# Patient Record
Sex: Female | Born: 1945
Health system: Southern US, Community
[De-identification: ages and names within clinical notes are randomized; demographics above are authoritative.]

## PROBLEM LIST (undated history)

## (undated) DIAGNOSIS — D229 Melanocytic nevi, unspecified: Secondary | ICD-10-CM

## (undated) DIAGNOSIS — R0989 Other specified symptoms and signs involving the circulatory and respiratory systems: Secondary | ICD-10-CM

## (undated) DIAGNOSIS — F32A Depression, unspecified: Secondary | ICD-10-CM

## (undated) DIAGNOSIS — K219 Gastro-esophageal reflux disease without esophagitis: Secondary | ICD-10-CM

## (undated) DIAGNOSIS — F329 Major depressive disorder, single episode, unspecified: Secondary | ICD-10-CM

## (undated) DIAGNOSIS — F419 Anxiety disorder, unspecified: Secondary | ICD-10-CM

## (undated) DIAGNOSIS — C439 Malignant melanoma of skin, unspecified: Secondary | ICD-10-CM

## (undated) DIAGNOSIS — G5600 Carpal tunnel syndrome, unspecified upper limb: Secondary | ICD-10-CM

## (undated) DIAGNOSIS — M1990 Unspecified osteoarthritis, unspecified site: Secondary | ICD-10-CM

## (undated) DIAGNOSIS — N189 Chronic kidney disease, unspecified: Secondary | ICD-10-CM

## (undated) DIAGNOSIS — R51 Headache: Secondary | ICD-10-CM

## (undated) DIAGNOSIS — I10 Essential (primary) hypertension: Secondary | ICD-10-CM

## (undated) DIAGNOSIS — Z460 Encounter for fitting and adjustment of spectacles and contact lenses: Secondary | ICD-10-CM

## (undated) DIAGNOSIS — G479 Sleep disorder, unspecified: Secondary | ICD-10-CM

## (undated) HISTORY — PX: TRIGGER FINGER RELEASE: SHX641

## (undated) HISTORY — PX: KNEE ARTHROSCOPY: SUR90

## (undated) HISTORY — DX: Malignant melanoma of skin, unspecified: C43.9

## (undated) HISTORY — PX: TONSILLECTOMY: SUR1361

## (undated) HISTORY — DX: Melanocytic nevi, unspecified: D22.9

---

## 1975-08-01 DIAGNOSIS — E039 Hypothyroidism, unspecified: Secondary | ICD-10-CM

## 1975-08-01 HISTORY — DX: Hypothyroidism, unspecified: E03.9

## 1998-09-14 DIAGNOSIS — D229 Melanocytic nevi, unspecified: Secondary | ICD-10-CM

## 1998-09-14 HISTORY — DX: Melanocytic nevi, unspecified: D22.9

## 2001-08-20 ENCOUNTER — Ambulatory Visit (HOSPITAL_BASED_OUTPATIENT_CLINIC_OR_DEPARTMENT_OTHER): Admission: RE | Admit: 2001-08-20 | Discharge: 2001-08-20 | Payer: Self-pay | Admitting: Family Medicine

## 2005-12-14 ENCOUNTER — Ambulatory Visit: Payer: Self-pay | Admitting: Cardiology

## 2009-07-31 HISTORY — PX: MELANOMA EXCISION: SHX5266

## 2010-06-22 DIAGNOSIS — C439 Malignant melanoma of skin, unspecified: Secondary | ICD-10-CM

## 2010-06-22 HISTORY — DX: Malignant melanoma of skin, unspecified: C43.9

## 2012-03-28 DIAGNOSIS — H2510 Age-related nuclear cataract, unspecified eye: Secondary | ICD-10-CM | POA: Diagnosis not present

## 2012-04-03 DIAGNOSIS — IMO0002 Reserved for concepts with insufficient information to code with codable children: Secondary | ICD-10-CM | POA: Diagnosis not present

## 2012-04-03 DIAGNOSIS — M1710 Unilateral primary osteoarthritis, unspecified knee: Secondary | ICD-10-CM | POA: Diagnosis not present

## 2012-04-08 DIAGNOSIS — M19049 Primary osteoarthritis, unspecified hand: Secondary | ICD-10-CM | POA: Diagnosis not present

## 2012-04-08 DIAGNOSIS — G5600 Carpal tunnel syndrome, unspecified upper limb: Secondary | ICD-10-CM | POA: Diagnosis not present

## 2012-05-01 DIAGNOSIS — IMO0002 Reserved for concepts with insufficient information to code with codable children: Secondary | ICD-10-CM | POA: Diagnosis not present

## 2012-05-01 DIAGNOSIS — M1710 Unilateral primary osteoarthritis, unspecified knee: Secondary | ICD-10-CM | POA: Diagnosis not present

## 2012-05-15 DIAGNOSIS — I10 Essential (primary) hypertension: Secondary | ICD-10-CM | POA: Diagnosis not present

## 2012-05-15 DIAGNOSIS — E7800 Pure hypercholesterolemia, unspecified: Secondary | ICD-10-CM | POA: Diagnosis not present

## 2012-05-15 DIAGNOSIS — R7309 Other abnormal glucose: Secondary | ICD-10-CM | POA: Diagnosis not present

## 2012-05-22 DIAGNOSIS — Z23 Encounter for immunization: Secondary | ICD-10-CM | POA: Diagnosis not present

## 2012-05-22 DIAGNOSIS — K219 Gastro-esophageal reflux disease without esophagitis: Secondary | ICD-10-CM | POA: Diagnosis not present

## 2012-05-22 DIAGNOSIS — E039 Hypothyroidism, unspecified: Secondary | ICD-10-CM | POA: Diagnosis not present

## 2012-05-22 DIAGNOSIS — E7800 Pure hypercholesterolemia, unspecified: Secondary | ICD-10-CM | POA: Diagnosis not present

## 2012-05-22 DIAGNOSIS — R7309 Other abnormal glucose: Secondary | ICD-10-CM | POA: Diagnosis not present

## 2012-05-22 DIAGNOSIS — G4700 Insomnia, unspecified: Secondary | ICD-10-CM | POA: Diagnosis not present

## 2012-05-22 DIAGNOSIS — I10 Essential (primary) hypertension: Secondary | ICD-10-CM | POA: Diagnosis not present

## 2012-07-23 ENCOUNTER — Encounter (HOSPITAL_COMMUNITY): Payer: Self-pay | Admitting: Pharmacy Technician

## 2012-07-29 ENCOUNTER — Ambulatory Visit (HOSPITAL_COMMUNITY)
Admission: RE | Admit: 2012-07-29 | Discharge: 2012-07-29 | Disposition: A | Discharge status: Home / Self Care | Payer: Medicare Other | Source: Ambulatory Visit | Attending: Orthopedic Surgery | Admitting: Orthopedic Surgery

## 2012-07-29 ENCOUNTER — Encounter (HOSPITAL_COMMUNITY)
Admission: RE | Admit: 2012-07-29 | Discharge: 2012-07-29 | Disposition: A | Discharge status: Home / Self Care | Payer: Medicare Other | Source: Ambulatory Visit | Attending: Orthopedic Surgery | Admitting: Orthopedic Surgery

## 2012-07-29 ENCOUNTER — Other Ambulatory Visit: Payer: Self-pay

## 2012-07-29 ENCOUNTER — Encounter (HOSPITAL_COMMUNITY): Payer: Self-pay

## 2012-07-29 DIAGNOSIS — Z01818 Encounter for other preprocedural examination: Secondary | ICD-10-CM | POA: Insufficient documentation

## 2012-07-29 DIAGNOSIS — R091 Pleurisy: Secondary | ICD-10-CM | POA: Diagnosis not present

## 2012-07-29 HISTORY — DX: Headache: R51

## 2012-07-29 HISTORY — DX: Carpal tunnel syndrome, unspecified upper limb: G56.00

## 2012-07-29 HISTORY — DX: Major depressive disorder, single episode, unspecified: F32.9

## 2012-07-29 HISTORY — DX: Anxiety disorder, unspecified: F41.9

## 2012-07-29 HISTORY — DX: Depression, unspecified: F32.A

## 2012-07-29 HISTORY — DX: Unspecified osteoarthritis, unspecified site: M19.90

## 2012-07-29 HISTORY — DX: Gastro-esophageal reflux disease without esophagitis: K21.9

## 2012-07-29 HISTORY — DX: Other specified symptoms and signs involving the circulatory and respiratory systems: R09.89

## 2012-07-29 HISTORY — DX: Sleep disorder, unspecified: G47.9

## 2012-07-29 HISTORY — DX: Chronic kidney disease, unspecified: N18.9

## 2012-07-29 HISTORY — DX: Malignant melanoma of skin, unspecified: C43.9

## 2012-07-29 HISTORY — DX: Essential (primary) hypertension: I10

## 2012-07-29 LAB — CBC
HCT: 44.2 % (ref 36.0–46.0)
Hemoglobin: 15 g/dL (ref 12.0–15.0)
MCH: 30.3 pg (ref 26.0–34.0)
MCHC: 33.9 g/dL (ref 30.0–36.0)
MCV: 89.3 fL (ref 78.0–100.0)
Platelets: 286 10*3/uL (ref 150–400)
RBC: 4.95 MIL/uL (ref 3.87–5.11)
RDW: 12.6 % (ref 11.5–15.5)
WBC: 7.4 10*3/uL (ref 4.0–10.5)

## 2012-07-29 LAB — SURGICAL PCR SCREEN
MRSA, PCR: NEGATIVE
Staphylococcus aureus: NEGATIVE

## 2012-07-29 LAB — URINALYSIS, ROUTINE W REFLEX MICROSCOPIC
Bilirubin Urine: NEGATIVE
Glucose, UA: NEGATIVE mg/dL
Hgb urine dipstick: NEGATIVE
Ketones, ur: NEGATIVE mg/dL
Leukocytes, UA: NEGATIVE
Nitrite: NEGATIVE
Protein, ur: NEGATIVE mg/dL
Specific Gravity, Urine: 1.012 (ref 1.005–1.030)
Urobilinogen, UA: 0.2 mg/dL (ref 0.0–1.0)
pH: 5 (ref 5.0–8.0)

## 2012-07-29 LAB — BASIC METABOLIC PANEL
BUN: 15 mg/dL (ref 6–23)
CO2: 27 mEq/L (ref 19–32)
Calcium: 9.6 mg/dL (ref 8.4–10.5)
Chloride: 98 mEq/L (ref 96–112)
Creatinine, Ser: 1.07 mg/dL (ref 0.50–1.10)
GFR calc Af Amer: 62 mL/min — ABNORMAL LOW (ref >90)
GFR calc non Af Amer: 53 mL/min — ABNORMAL LOW (ref >90)
Glucose, Bld: 81 mg/dL (ref 70–99)
Potassium: 4.2 mEq/L (ref 3.5–5.1)
Sodium: 136 mEq/L (ref 135–145)

## 2012-07-29 LAB — APTT: aPTT: 28 seconds (ref 24–37)

## 2012-07-29 LAB — PROTIME-INR
INR: 0.95 (ref 0.00–1.49)
Prothrombin Time: 12.6 seconds (ref 11.6–15.2)

## 2012-07-29 NOTE — Patient Instructions (Addendum)
Penny Morgan  07/29/2012                           YOUR PROCEDURE IS SCHEDULED ON:  08/05/12               PLEASE REPORT TO SHORT STAY CENTER AT :  10:00               CALL THIS NUMBER IF ANY PROBLEMS THE DAY OF SURGERY :               [phone]                      REMEMBER:   Do not eat food or drink liquids AFTER MIDNIGHT  May have clear liquids UNTIL 6 HOURS BEFORE SURGERY (6:30 AM)  Clear liquids include soda, tea, black coffee, apple or grape juice, broth.  Take these medicines the morning of surgery with A SIP OF WATER:  LIPITOR / LEVOTHYROXINE / SINGULAIR / OMEPRAZOLE / EFFEXOR / TRAMADOL IF NEEDED   Do not wear jewelry, make-up   Do not wear lotions, powders, or perfumes.   Do not shave legs or underarms 12 hrs. before surgery (men may shave face)  Do not bring valuables to the hospital.  Contacts, dentures or bridgework may not be worn into surgery.  Leave suitcase in the car. After surgery it may be brought to your room.  For patients admitted to the hospital more than one night, checkout time is 11:00                          The day of discharge.   Patients discharged the day of surgery will not be allowed to drive home                             If going home same day of surgery, must have someone stay with you first                           24 hrs at home and arrange for some one to drive you home from hospital.    Special Instructions:   Please read over the following fact sheets that you were given:               1. MRSA  INFORMATION                      2. [place] PREPARING FOR SURGERY SHEET              3. INCENTIVE SPIROMETER                                                X_____________________________________________________________________        Failure to follow these instructions may result in cancellation of your surgery

## 2012-07-29 NOTE — Progress Notes (Signed)
07/29/12 1421  OBSTRUCTIVE SLEEP APNEA  Have you ever been diagnosed with sleep apnea through a sleep study? No  Do you snore loudly (loud enough to be heard through closed doors)?  1  Do you often feel tired, fatigued, or sleepy during the daytime? 1  Has anyone observed you stop breathing during your sleep? 1  Do you have, or are you being treated for high blood pressure? 1  BMI more than 35 kg/m2? 0  Age over 65 years old? 1  Neck circumference greater than 40 cm/18 inches? 0  Gender: 0  Obstructive Sleep Apnea Score 5   Score 4 or greater  Results sent to PCP

## 2012-08-02 NOTE — H&P (Signed)
TOTAL KNEE ADMISSION H&P  Patient is being admitted for left medial unicompartmental knee arthroplasty.  Subjective:  Chief Complaint:    Left knee OA / pain.  HPI: Penny Morgan, 67 y.o. female, has a history of pain and functional disability in the left knee due to arthritis and has failed non-surgical conservative treatments for greater than 12 weeks to includeNSAID's and/or analgesics, corticosteriod injections, viscosupplementation injections and activity modification.  Onset of symptoms was gradual, starting 4 years ago with gradually worsening course since that time. The patient noted prior procedures on the knee to include  arthroscopy on the left knee(s).  Patient currently rates pain in the left knee(s) at 10 out of 10 with activity. Patient has night pain, worsening of pain with activity and weight bearing, pain that interferes with activities of daily living, pain with passive range of motion, crepitus and joint swelling.  Patient has evidence of periarticular osteophytes and joint space narrowing of the medial compartment by imaging studies. There is no active infection.  Risks, benefits and expectations were discussed with the patient. Patient understand the risks, benefits and expectations and wishes to proceed with surgery.   D/C Plans:  Home with HHPT  Post-op Meds:   Rx given for Xarelto, Robaxin, Iron, Colace and MiraLax  Tranexamic Acid:   To be given  Decadron:   To be given   FYI:   Suggested to limit ASA do to stage 4 renal disease, will use Xarelto post-op           BP meds are just used to decrease BP for the stress on the kidneys  Past Medical History  Diagnosis Date  . Chronic kidney disease     STAGE III KIDNEY DISEASE  . Hypertension     TAKES BP MED "FOR KIDNEYS" HAS HAD ELEVATED BP IN GTHE PAST  . Chest congestion     CHRONIC  . Headache     MIGRAINES  . Arthritis   . Carpal tunnel syndrome   . Melanoma 2 YRS AGO  . GERD (gastroesophageal reflux  disease)   . Difficulty sleeping   . Depression   . Anxiety     Past Surgical History  Procedure Date  . Knee arthroscopy     L KNEE  . Tonsillectomy   . Trigger finger release     L HAND    No prescriptions prior to admission   Allergies  Allergen Reactions  . Sulfa Antibiotics Rash    Turned red on legs    History  Substance Use Topics  . Smoking status: Passive Smoke Exposure - Never Smoker  . Smokeless tobacco: Not on file  . Alcohol Use:      Comment: OCCASIONAL       Review of Systems  Constitutional: Negative.   HENT: Positive for hearing loss and tinnitus.   Eyes: Negative.   Respiratory: Negative.   Cardiovascular: Negative.   Gastrointestinal: Positive for heartburn.  Genitourinary: Positive for frequency.  Musculoskeletal: Positive for myalgias and joint pain.  Skin: Negative.   Neurological: Positive for headaches.  Endo/Heme/Allergies: Negative.   Psychiatric/Behavioral: Negative.     Objective:  Physical Exam  Constitutional: She is oriented to person, place, and time. She appears well-developed and well-nourished.  HENT:  Head: Normocephalic and atraumatic.  Mouth/Throat: Oropharynx is clear and moist.  Eyes: Pupils are equal, round, and reactive to light.  Neck: Neck supple. No JVD present. No tracheal deviation present. No thyromegaly present.  Cardiovascular: Normal rate,  regular rhythm, normal heart sounds and intact distal pulses.   Respiratory: Effort normal and breath sounds normal. No stridor. No respiratory distress. She has no wheezes.  GI: Soft. There is no tenderness. There is no guarding.  Musculoskeletal:       Left knee: She exhibits decreased range of motion, swelling and bony tenderness. She exhibits no effusion, no ecchymosis, no laceration and no erythema. tenderness found. Medial joint line tenderness noted. No lateral joint line tenderness noted.  Lymphadenopathy:    She has no cervical adenopathy.  Neurological: She is  alert and oriented to person, place, and time.  Skin: Skin is warm and dry.  Psychiatric: She has a normal mood and affect.     Imaging Review Plain radiographs demonstrate moderate degenerative joint disease of the left knee medial compartment. The overall alignment isneutral. The bone quality appears to be good for age and reported activity level.  Assessment/Plan:  End stage arthritis, left knee   The patient history, physical examination, clinical judgment of the provider and imaging studies are consistent with end stage degenerative joint disease of the left knee(s) and total knee arthroplasty is deemed medically necessary. The treatment options including medical management, injection therapy arthroscopy and arthroplasty were discussed at length. The risks and benefits of total knee arthroplasty were presented and reviewed. The risks due to aseptic loosening, infection, stiffness, patella tracking problems, thromboembolic complications and other imponderables were discussed. The patient acknowledged the explanation, agreed to proceed with the plan and consent was signed. Patient is being admitted for inpatient treatment for surgery, pain control, PT, OT, prophylactic antibiotics, VTE prophylaxis, progressive ambulation and ADL's and discharge planning. The patient is planning to be discharged home with home health services.    Anastasio Auerbach Jaeveon Ashland   PAC  08/02/2012, 3:41 PM

## 2012-08-04 MED ORDER — CEFAZOLIN SODIUM-DEXTROSE 2-3 GM-% IV SOLR
2.0000 g | INTRAVENOUS | Status: AC
  Administered 2012-08-05: 2 g via INTRAVENOUS

## 2012-08-05 ENCOUNTER — Encounter (HOSPITAL_COMMUNITY): Payer: Self-pay | Admitting: Anesthesiology

## 2012-08-05 ENCOUNTER — Inpatient Hospital Stay (HOSPITAL_COMMUNITY)
Admission: RE | Admit: 2012-08-05 | Discharge: 2012-08-06 | DRG: 470 | Disposition: A | Discharge status: Home Health | Payer: Medicare Other | Source: Ambulatory Visit | Attending: Orthopedic Surgery | Admitting: Orthopedic Surgery

## 2012-08-05 ENCOUNTER — Encounter (HOSPITAL_COMMUNITY): Payer: Self-pay | Admitting: *Deleted

## 2012-08-05 ENCOUNTER — Inpatient Hospital Stay (HOSPITAL_COMMUNITY): Payer: Medicare Other | Admitting: Anesthesiology

## 2012-08-05 ENCOUNTER — Encounter (HOSPITAL_COMMUNITY)
Admission: RE | Disposition: A | Discharge status: Home Health | Payer: Self-pay | Source: Ambulatory Visit | Attending: Orthopedic Surgery

## 2012-08-05 DIAGNOSIS — N183 Chronic kidney disease, stage 3 unspecified: Secondary | ICD-10-CM | POA: Diagnosis present

## 2012-08-05 DIAGNOSIS — E669 Obesity, unspecified: Secondary | ICD-10-CM | POA: Diagnosis not present

## 2012-08-05 DIAGNOSIS — D500 Iron deficiency anemia secondary to blood loss (chronic): Secondary | ICD-10-CM | POA: Diagnosis not present

## 2012-08-05 DIAGNOSIS — N189 Chronic kidney disease, unspecified: Secondary | ICD-10-CM | POA: Diagnosis not present

## 2012-08-05 DIAGNOSIS — M1710 Unilateral primary osteoarthritis, unspecified knee: Secondary | ICD-10-CM | POA: Diagnosis not present

## 2012-08-05 DIAGNOSIS — M25569 Pain in unspecified knee: Secondary | ICD-10-CM | POA: Diagnosis not present

## 2012-08-05 DIAGNOSIS — Z6830 Body mass index (BMI) 30.0-30.9, adult: Secondary | ICD-10-CM

## 2012-08-05 DIAGNOSIS — F3289 Other specified depressive episodes: Secondary | ICD-10-CM | POA: Diagnosis present

## 2012-08-05 DIAGNOSIS — F411 Generalized anxiety disorder: Secondary | ICD-10-CM | POA: Diagnosis present

## 2012-08-05 DIAGNOSIS — F329 Major depressive disorder, single episode, unspecified: Secondary | ICD-10-CM | POA: Diagnosis present

## 2012-08-05 DIAGNOSIS — I129 Hypertensive chronic kidney disease with stage 1 through stage 4 chronic kidney disease, or unspecified chronic kidney disease: Secondary | ICD-10-CM | POA: Diagnosis present

## 2012-08-05 DIAGNOSIS — I10 Essential (primary) hypertension: Secondary | ICD-10-CM | POA: Diagnosis not present

## 2012-08-05 DIAGNOSIS — K219 Gastro-esophageal reflux disease without esophagitis: Secondary | ICD-10-CM | POA: Diagnosis not present

## 2012-08-05 DIAGNOSIS — D62 Acute posthemorrhagic anemia: Secondary | ICD-10-CM | POA: Diagnosis not present

## 2012-08-05 DIAGNOSIS — Z96652 Presence of left artificial knee joint: Secondary | ICD-10-CM

## 2012-08-05 DIAGNOSIS — IMO0002 Reserved for concepts with insufficient information to code with codable children: Secondary | ICD-10-CM | POA: Diagnosis not present

## 2012-08-05 HISTORY — PX: PARTIAL KNEE ARTHROPLASTY: SHX2174

## 2012-08-05 LAB — TYPE AND SCREEN
ABO/RH(D): A POS
Antibody Screen: NEGATIVE

## 2012-08-05 LAB — ABO/RH: ABO/RH(D): A POS

## 2012-08-05 SURGERY — ARTHROPLASTY, KNEE, UNICOMPARTMENTAL
Anesthesia: Spinal | Site: Knee | Laterality: Left | Wound class: Clean

## 2012-08-05 MED ORDER — ACETAMINOPHEN 10 MG/ML IV SOLN
INTRAVENOUS | Status: DC | PRN
  Administered 2012-08-05: 1000 mg via INTRAVENOUS

## 2012-08-05 MED ORDER — 0.9 % SODIUM CHLORIDE (POUR BTL) OPTIME
TOPICAL | Status: DC | PRN
  Administered 2012-08-05: 1000 mL

## 2012-08-05 MED ORDER — DEXAMETHASONE SODIUM PHOSPHATE 10 MG/ML IJ SOLN
10.0000 mg | Freq: Once | INTRAMUSCULAR | Status: AC
  Administered 2012-08-05: 10 mg via INTRAVENOUS

## 2012-08-05 MED ORDER — LIDOCAINE HCL (CARDIAC) 20 MG/ML IV SOLN
INTRAVENOUS | Status: DC | PRN
  Administered 2012-08-05: 50 mg via INTRAVENOUS

## 2012-08-05 MED ORDER — PHENYLEPHRINE HCL 10 MG/ML IJ SOLN
INTRAMUSCULAR | Status: DC | PRN
  Administered 2012-08-05 (×5): 40 ug via INTRAVENOUS

## 2012-08-05 MED ORDER — OMEPRAZOLE MAGNESIUM 20.6 (20 BASE) MG PO CPDR: 20.6000 mg | DELAYED_RELEASE_CAPSULE | Freq: Every day | ORAL | Status: DC

## 2012-08-05 MED ORDER — SUMATRIPTAN SUCCINATE 100 MG PO TABS
100.0000 mg | ORAL_TABLET | ORAL | Status: DC | PRN
  Filled 2012-08-05: qty 1

## 2012-08-05 MED ORDER — LEVOTHYROXINE SODIUM 88 MCG PO TABS
88.0000 ug | ORAL_TABLET | ORAL | Status: DC
  Filled 2012-08-05: qty 1

## 2012-08-05 MED ORDER — MEPERIDINE HCL 50 MG/ML IJ SOLN: 6.2500 mg | INTRAMUSCULAR | Status: DC | PRN

## 2012-08-05 MED ORDER — ZOLPIDEM TARTRATE 5 MG PO TABS: 5.0000 mg | ORAL_TABLET | Freq: Every evening | ORAL | Status: DC | PRN

## 2012-08-05 MED ORDER — DOCUSATE SODIUM 100 MG PO CAPS
100.0000 mg | ORAL_CAPSULE | Freq: Two times a day (BID) | ORAL | Status: DC
  Administered 2012-08-05 – 2012-08-06 (×2): 100 mg via ORAL

## 2012-08-05 MED ORDER — LACTATED RINGERS IV SOLN
INTRAVENOUS | Status: DC
  Administered 2012-08-05: 1000 mL via INTRAVENOUS
  Administered 2012-08-05 (×2): via INTRAVENOUS

## 2012-08-05 MED ORDER — HYDROCODONE-ACETAMINOPHEN 7.5-325 MG PO TABS
1.0000 | ORAL_TABLET | ORAL | Status: DC
  Administered 2012-08-05 – 2012-08-06 (×6): 2 via ORAL
  Filled 2012-08-05 (×6): qty 2

## 2012-08-05 MED ORDER — DIPHENHYDRAMINE HCL 25 MG PO CAPS: 25.0000 mg | ORAL_CAPSULE | Freq: Four times a day (QID) | ORAL | Status: DC | PRN

## 2012-08-05 MED ORDER — EPHEDRINE SULFATE 50 MG/ML IJ SOLN
INTRAMUSCULAR | Status: DC | PRN
  Administered 2012-08-05: 10 mg via INTRAVENOUS
  Administered 2012-08-05 (×2): 5 mg via INTRAVENOUS

## 2012-08-05 MED ORDER — ONDANSETRON HCL 4 MG PO TABS: 4.0000 mg | ORAL_TABLET | Freq: Four times a day (QID) | ORAL | Status: DC | PRN

## 2012-08-05 MED ORDER — CEFAZOLIN SODIUM-DEXTROSE 2-3 GM-% IV SOLR
2.0000 g | Freq: Four times a day (QID) | INTRAVENOUS | Status: AC
  Administered 2012-08-05 – 2012-08-06 (×2): 2 g via INTRAVENOUS
  Filled 2012-08-05 (×2): qty 50

## 2012-08-05 MED ORDER — LEVOTHYROXINE SODIUM 100 MCG PO TABS
100.0000 ug | ORAL_TABLET | ORAL | Status: DC
  Administered 2012-08-06: 100 ug via ORAL
  Filled 2012-08-05: qty 1

## 2012-08-05 MED ORDER — MEDROXYPROGESTERONE ACETATE 2.5 MG PO TABS
2.5000 mg | ORAL_TABLET | Freq: Every day | ORAL | Status: DC
  Administered 2012-08-05: 2.5 mg via ORAL
  Filled 2012-08-05 (×2): qty 1

## 2012-08-05 MED ORDER — METHOCARBAMOL 500 MG PO TABS
500.0000 mg | ORAL_TABLET | Freq: Four times a day (QID) | ORAL | Status: DC | PRN
  Administered 2012-08-06: 500 mg via ORAL
  Filled 2012-08-05: qty 1

## 2012-08-05 MED ORDER — HYDROMORPHONE HCL PF 1 MG/ML IJ SOLN: 0.5000 mg | INTRAMUSCULAR | Status: DC | PRN

## 2012-08-05 MED ORDER — LORAZEPAM 1 MG PO TABS: 1.0000 mg | ORAL_TABLET | Freq: Three times a day (TID) | ORAL | Status: DC | PRN

## 2012-08-05 MED ORDER — KETOROLAC TROMETHAMINE 30 MG/ML IJ SOLN
INTRAMUSCULAR | Status: DC | PRN
  Administered 2012-08-05: 30 mg

## 2012-08-05 MED ORDER — POTASSIUM 99 MG PO TABS: 99.0000 mg | ORAL_TABLET | Freq: Every day | ORAL | Status: DC

## 2012-08-05 MED ORDER — PHENOL 1.4 % MT LIQD
1.0000 | OROMUCOSAL | Status: DC | PRN
Start: 2012-08-05 — End: 2012-08-06
  Filled 2012-08-05: qty 177

## 2012-08-05 MED ORDER — LACTATED RINGERS IV SOLN: INTRAVENOUS | Status: DC

## 2012-08-05 MED ORDER — BISACODYL 10 MG RE SUPP: 10.0000 mg | Freq: Every day | RECTAL | Status: DC | PRN

## 2012-08-05 MED ORDER — MENTHOL 3 MG MT LOZG
1.0000 | LOZENGE | OROMUCOSAL | Status: DC | PRN
  Filled 2012-08-05: qty 9

## 2012-08-05 MED ORDER — RIZATRIPTAN BENZOATE 10 MG PO TBDP: 10.0000 mg | ORAL_TABLET | ORAL | Status: DC | PRN

## 2012-08-05 MED ORDER — BUPIVACAINE-EPINEPHRINE 0.25% -1:200000 IJ SOLN
INTRAMUSCULAR | Status: DC | PRN
  Administered 2012-08-05: 30 mL

## 2012-08-05 MED ORDER — DEXAMETHASONE SODIUM PHOSPHATE 10 MG/ML IJ SOLN
10.0000 mg | Freq: Once | INTRAMUSCULAR | Status: DC
  Filled 2012-08-05: qty 1

## 2012-08-05 MED ORDER — POTASSIUM GLUCONATE 595 (99 K) MG PO TABS
595.0000 mg | ORAL_TABLET | Freq: Every day | ORAL | Status: DC
  Administered 2012-08-06: 595 mg via ORAL
  Filled 2012-08-05: qty 1

## 2012-08-05 MED ORDER — RIVAROXABAN 10 MG PO TABS
10.0000 mg | ORAL_TABLET | ORAL | Status: DC
  Administered 2012-08-06: 10 mg via ORAL
  Filled 2012-08-05 (×2): qty 1

## 2012-08-05 MED ORDER — PANTOPRAZOLE SODIUM 40 MG PO TBEC
40.0000 mg | DELAYED_RELEASE_TABLET | Freq: Every day | ORAL | Status: DC
  Administered 2012-08-06: 40 mg via ORAL
  Filled 2012-08-05: qty 1

## 2012-08-05 MED ORDER — ONDANSETRON HCL 4 MG/2ML IJ SOLN: 4.0000 mg | Freq: Four times a day (QID) | INTRAMUSCULAR | Status: DC | PRN

## 2012-08-05 MED ORDER — ALUM & MAG HYDROXIDE-SIMETH 200-200-20 MG/5ML PO SUSP: 30.0000 mL | ORAL | Status: DC | PRN

## 2012-08-05 MED ORDER — IRBESARTAN 150 MG PO TABS
150.0000 mg | ORAL_TABLET | Freq: Every day | ORAL | Status: DC
  Administered 2012-08-06: 150 mg via ORAL
  Filled 2012-08-05: qty 1

## 2012-08-05 MED ORDER — PROMETHAZINE HCL 25 MG/ML IJ SOLN: 6.2500 mg | INTRAMUSCULAR | Status: DC | PRN

## 2012-08-05 MED ORDER — VENLAFAXINE HCL ER 75 MG PO CP24
75.0000 mg | ORAL_CAPSULE | Freq: Every morning | ORAL | Status: DC
  Administered 2012-08-06: 75 mg via ORAL
  Filled 2012-08-05: qty 1

## 2012-08-05 MED ORDER — HYDROMORPHONE HCL PF 1 MG/ML IJ SOLN
0.2500 mg | INTRAMUSCULAR | Status: DC | PRN
  Administered 2012-08-05 (×2): 0.5 mg via INTRAVENOUS

## 2012-08-05 MED ORDER — MIDAZOLAM HCL 5 MG/5ML IJ SOLN
INTRAMUSCULAR | Status: DC | PRN
  Administered 2012-08-05 (×3): 0.5 mg via INTRAVENOUS
  Administered 2012-08-05: 2 mg via INTRAVENOUS

## 2012-08-05 MED ORDER — ATORVASTATIN CALCIUM 20 MG PO TABS
20.0000 mg | ORAL_TABLET | Freq: Every morning | ORAL | Status: DC
  Administered 2012-08-06: 20 mg via ORAL
  Filled 2012-08-05: qty 1

## 2012-08-05 MED ORDER — FUROSEMIDE 20 MG PO TABS
20.0000 mg | ORAL_TABLET | Freq: Every day | ORAL | Status: DC | PRN
  Filled 2012-08-05: qty 1

## 2012-08-05 MED ORDER — ESTRADIOL 1 MG PO TABS
0.5000 mg | ORAL_TABLET | Freq: Every day | ORAL | Status: DC
  Administered 2012-08-05: 0.5 mg via ORAL
  Filled 2012-08-05 (×2): qty 0.5

## 2012-08-05 MED ORDER — CHLORHEXIDINE GLUCONATE 4 % EX LIQD: 60.0000 mL | Freq: Once | CUTANEOUS | Status: DC

## 2012-08-05 MED ORDER — MONTELUKAST SODIUM 10 MG PO TABS
10.0000 mg | ORAL_TABLET | Freq: Every morning | ORAL | Status: DC
  Administered 2012-08-06: 10 mg via ORAL
  Filled 2012-08-05: qty 1

## 2012-08-05 MED ORDER — SODIUM CHLORIDE 0.9 % IV SOLN
INTRAVENOUS | Status: DC
  Administered 2012-08-05 – 2012-08-06 (×2): via INTRAVENOUS
  Filled 2012-08-05 (×9): qty 1000

## 2012-08-05 MED ORDER — FERROUS SULFATE 325 (65 FE) MG PO TABS
325.0000 mg | ORAL_TABLET | Freq: Three times a day (TID) | ORAL | Status: DC
  Administered 2012-08-05 – 2012-08-06 (×2): 325 mg via ORAL
  Filled 2012-08-05 (×5): qty 1

## 2012-08-05 MED ORDER — FLEET ENEMA 7-19 GM/118ML RE ENEM: 1.0000 | ENEMA | Freq: Once | RECTAL | Status: AC | PRN

## 2012-08-05 MED ORDER — POLYETHYLENE GLYCOL 3350 17 G PO PACK
17.0000 g | PACK | Freq: Two times a day (BID) | ORAL | Status: DC
  Administered 2012-08-05 – 2012-08-06 (×2): 17 g via ORAL

## 2012-08-05 MED ORDER — FLUTICASONE PROPIONATE 50 MCG/ACT NA SUSP
2.0000 | Freq: Every day | NASAL | Status: DC | PRN
  Filled 2012-08-05 (×2): qty 16

## 2012-08-05 MED ORDER — TRANEXAMIC ACID 100 MG/ML IV SOLN
15.0000 mg/kg | Freq: Once | INTRAVENOUS | Status: AC
  Administered 2012-08-05: 1280 mg via INTRAVENOUS
  Filled 2012-08-05: qty 12.8

## 2012-08-05 MED ORDER — FENTANYL CITRATE 0.05 MG/ML IJ SOLN
INTRAMUSCULAR | Status: DC | PRN
  Administered 2012-08-05: 50 ug via INTRAVENOUS
  Administered 2012-08-05: 100 ug via INTRAVENOUS

## 2012-08-05 MED ORDER — PROPOFOL 10 MG/ML IV EMUL
INTRAVENOUS | Status: DC | PRN
  Administered 2012-08-05: 100 ug/kg/min via INTRAVENOUS

## 2012-08-05 MED ORDER — METHOCARBAMOL 100 MG/ML IJ SOLN
500.0000 mg | Freq: Four times a day (QID) | INTRAVENOUS | Status: DC | PRN
  Administered 2012-08-05: 500 mg via INTRAVENOUS
  Filled 2012-08-05: qty 5

## 2012-08-05 SURGICAL SUPPLY — 56 items
ADH SKN CLS APL DERMABOND .7 (GAUZE/BANDAGES/DRESSINGS) ×1
BAG SPEC THK2 15X12 ZIP CLS (MISCELLANEOUS) ×1
BAG ZIPLOCK 12X15 (MISCELLANEOUS) ×2 IMPLANT
BANDAGE ELASTIC 6 VELCRO ST LF (GAUZE/BANDAGES/DRESSINGS) ×2 IMPLANT
BANDAGE ESMARK 6X9 LF (GAUZE/BANDAGES/DRESSINGS) ×1 IMPLANT
BLADE SAW RECIPROCATING 77.5 (BLADE) ×2 IMPLANT
BLADE SAW SGTL 13.0X1.19X90.0M (BLADE) ×2 IMPLANT
BNDG CMPR 9X6 STRL LF SNTH (GAUZE/BANDAGES/DRESSINGS) ×1
BNDG ESMARK 6X9 LF (GAUZE/BANDAGES/DRESSINGS) ×2
BOWL SMART MIX CTS (DISPOSABLE) ×2 IMPLANT
CEMENT HV SMART SET (Cement) ×2 IMPLANT
CLOTH BEACON ORANGE TIMEOUT ST (SAFETY) ×2 IMPLANT
COVER SURGICAL LIGHT HANDLE (MISCELLANEOUS) ×1 IMPLANT
CUFF TOURN SGL QUICK 34 (TOURNIQUET CUFF) ×2
CUFF TRNQT CYL 34X4X40X1 (TOURNIQUET CUFF) ×1 IMPLANT
DERMABOND ADVANCED (GAUZE/BANDAGES/DRESSINGS) ×1
DERMABOND ADVANCED .7 DNX12 (GAUZE/BANDAGES/DRESSINGS) ×1 IMPLANT
DRAPE EXTREMITY T 121X128X90 (DRAPE) ×2 IMPLANT
DRAPE POUCH INSTRU U-SHP 10X18 (DRAPES) ×2 IMPLANT
DRSG AQUACEL AG ADV 3.5X 6 (GAUZE/BANDAGES/DRESSINGS) ×2 IMPLANT
DRSG AQUACEL AG ADV 3.5X10 (GAUZE/BANDAGES/DRESSINGS) ×1 IMPLANT
DRSG TEGADERM 4X4.75 (GAUZE/BANDAGES/DRESSINGS) ×2 IMPLANT
DURAPREP 26ML APPLICATOR (WOUND CARE) ×2 IMPLANT
ELECT REM PT RETURN 9FT ADLT (ELECTROSURGICAL) ×2
ELECTRODE REM PT RTRN 9FT ADLT (ELECTROSURGICAL) ×1 IMPLANT
EVACUATOR 1/8 PVC DRAIN (DRAIN) ×2 IMPLANT
FACESHIELD LNG OPTICON STERILE (SAFETY) ×8 IMPLANT
GAUZE SPONGE 2X2 8PLY STRL LF (GAUZE/BANDAGES/DRESSINGS) ×1 IMPLANT
GLOVE BIOGEL PI IND STRL 7.5 (GLOVE) ×1 IMPLANT
GLOVE BIOGEL PI IND STRL 8 (GLOVE) IMPLANT
GLOVE BIOGEL PI INDICATOR 7.5 (GLOVE) ×1
GLOVE BIOGEL PI INDICATOR 8 (GLOVE)
GLOVE ORTHO TXT STRL SZ7.5 (GLOVE) ×4 IMPLANT
GOWN BRE IMP PREV XXLGXLNG (GOWN DISPOSABLE) ×4 IMPLANT
GOWN STRL NON-REIN LRG LVL3 (GOWN DISPOSABLE) ×3 IMPLANT
IMMOBILIZER KNEE 20 (SOFTGOODS) ×2
IMMOBILIZER KNEE 20 THIGH 36 (SOFTGOODS) IMPLANT
KIT BASIN OR (CUSTOM PROCEDURE TRAY) ×2 IMPLANT
LEGGING LITHOTOMY PAIR STRL (DRAPES) ×2 IMPLANT
MANIFOLD NEPTUNE II (INSTRUMENTS) ×2 IMPLANT
NDL SAFETY ECLIPSE 18X1.5 (NEEDLE) ×1 IMPLANT
NEEDLE HYPO 18GX1.5 SHARP (NEEDLE) ×2
PACK TOTAL JOINT (CUSTOM PROCEDURE TRAY) ×2 IMPLANT
POSITIONER SURGICAL ARM (MISCELLANEOUS) ×2 IMPLANT
SPONGE GAUZE 2X2 STER 10/PKG (GAUZE/BANDAGES/DRESSINGS) ×1
SUCTION FRAZIER TIP 10 FR DISP (SUCTIONS) ×2 IMPLANT
SUT MNCRL AB 4-0 PS2 18 (SUTURE) ×2 IMPLANT
SUT VIC AB 1 CT1 36 (SUTURE) ×3 IMPLANT
SUT VIC AB 2-0 CT1 27 (SUTURE) ×4
SUT VIC AB 2-0 CT1 TAPERPNT 27 (SUTURE) ×2 IMPLANT
SUT VLOC 180 0 24IN GS25 (SUTURE) ×1 IMPLANT
SYR 50ML LL SCALE MARK (SYRINGE) ×2 IMPLANT
TOWEL OR 17X26 10 PK STRL BLUE (TOWEL DISPOSABLE) ×3 IMPLANT
TOWEL OR NON WOVEN STRL DISP B (DISPOSABLE) ×1 IMPLANT
TRAY FOLEY CATH 14FRSI W/METER (CATHETERS) ×2 IMPLANT
WATER STERILE IRR 1500ML POUR (IV SOLUTION) ×1 IMPLANT

## 2012-08-05 NOTE — Anesthesia Preprocedure Evaluation (Addendum)
Anesthesia Evaluation  Patient identified by MRN, date of birth, ID band Patient awake    Reviewed: Allergy & Precautions, H&P , NPO status , Patient's Chart, lab work & pertinent test results  Airway Mallampati: I TM Distance: >3 FB Neck ROM: Full    Dental No notable dental hx.    Pulmonary neg pulmonary ROS,  breath sounds clear to auscultation  Pulmonary exam normal       Cardiovascular hypertension, Pt. on medications negative cardio ROS  Rhythm:Regular Rate:Normal     Neuro/Psych PSYCHIATRIC DISORDERS Anxiety Depression negative neurological ROS  negative psych ROS   GI/Hepatic negative GI ROS, Neg liver ROS,   Endo/Other  negative endocrine ROS  Renal/GU Renal InsufficiencyRenal diseasenegative Renal ROS  negative genitourinary   Musculoskeletal negative musculoskeletal ROS (+)   Abdominal   Peds negative pediatric ROS (+)  Hematology negative hematology ROS (+)   Anesthesia Other Findings Multiple crowns  Reproductive/Obstetrics negative OB ROS                          Anesthesia Physical Anesthesia Plan  ASA: II  Anesthesia Plan: Spinal   Post-op Pain Management:    Induction:   Airway Management Planned: Simple Face Mask and Nasal Cannula  Additional Equipment:   Intra-op Plan:   Post-operative Plan:   Informed Consent: I have reviewed the patients History and Physical, chart, labs and discussed the procedure including the risks, benefits and alternatives for the proposed anesthesia with the patient or authorized representative who has indicated his/her understanding and acceptance.   Dental advisory given  Plan Discussed with: CRNA  Anesthesia Plan Comments:         Anesthesia Quick Evaluation

## 2012-08-05 NOTE — Interval H&P Note (Signed)
History and Physical Interval Note:  08/05/2012 12:11 PM  Penny Morgan  has presented today for surgery, with the diagnosis of Left Knee Medial Compartmental Osteoarthritis  The various methods of treatment have been discussed with the patient and family. After consideration of risks, benefits and other options for treatment, the patient has consented to  Procedure(s) (LRB) with comments: UNICOMPARTMENTAL KNEE (Left) as a surgical intervention .  The patient's history has been reviewed, patient examined, no change in status, stable for surgery.  I have reviewed the patient's chart and labs.  Questions were answered to the patient's satisfaction.     Shelda Pal

## 2012-08-05 NOTE — Preoperative (Signed)
Beta Blockers   Reason not to administer Beta Blockers:Not Applicable 

## 2012-08-05 NOTE — Op Note (Signed)
NAME: Penny Morgan    MEDICAL RECORD NO.: [id number]   FACILITY: [facility]   DATE OF BIRTH: Oct 28, 1945  PHYSICIAN: Madlyn Frankel. Charlann Boxer, M.D.    DATE OF PROCEDURE: 08/05/2012    OPERATIVE REPORT   PREOPERATIVE DIAGNOSIS: Left knee medial compartment osteoarthritis.   POSTOPERATIVE DIAGNOSIS: Left knee medial compartment osteoarthritis.  PROCEDURE: Left partial knee replacement utilizing Biomet Oxford knee  component, size small femur, a left medial size AA tibial tray with a size 4 small left medial insert.   SURGEON: Madlyn Frankel. Charlann Boxer, M.D.   ASSISTANT: Lanney Gins, PAC.  Please note that Mr. Penny Morgan was present for the entirety of the case,  utilized for preoperative positioning, perioperative retractor  management, general facilitation of the case and primary wound closure.   ANESTHESIA: Spinal.   SPECIMENS: None.   COMPLICATIONS: None.  DRAINS: 1 medium HV   TOURNIQUET TIME: 30 minutes at 250 mmHg.   INDICATIONS FOR PROCEDURE: [name] is 67 yo patient of mine who presented for evaluation of left knee pain.  They presented with primary complaints of pain on the medial side of their knee. Radiographs revealed advanced medial compartment arthritis with specifically an antero-medial wear pattern.  There was bone on bone changes noted with subchondral sclerosis and osteophytes present. The patient has had progressive problems failing to respond to conservative measures of medications, injections and activity modification. Risks of infection, DVT, component failure, need for future revision surgery were all discussed and reviewed.  Consent was obtained for benefit of pain relief.   PROCEDURE IN DETAIL: The patient was brought to the operative theater.  Once adequate anesthesia, preoperative antibiotics, 2 grams of Ancef administered, the patient was positioned in supine position with a left thigh tourniquet  placed. The left lower extremity was prepped and draped in sterile    fashion with the leg on the Oxford leg holder.  The leg was allowed to flex to 120 degrees. A time-out  was performed identifying the patient, planned procedure, and extremity.  The leg was exsanguinated, tourniquet elevated to 250 mmHg. A midline  incision was made from the proximal pole of the patella to the tibial tubercle. A  soft tissue plane was created and partial median arthrotomy was then  made to allow for subluxation of the patella. Following initial synovectomy and  debridement, the osteophytes were removed off the medial aspect of the  knee.   Attention was first directed to the tibia. The tibial  extramedullary guide was positioned over the anterior crest of the tibia  and pinned into position, and using a measured resection guide from the  Oxford system, a 4 mm resection was made off the proximal tibia. First  the reciprocating saw along the medial aspect of the tibial spines, then the oscillating saw.    At this point, I sized this cut surface seem to be best fit for a size AA tibial tray.  With the retractors out of the wound and the knee held at 90 degrees the 4 feeler gauge had appropriate tension on the medial ligament.   At this point, the femoral canal was opened with a drill and the  intramedullary rod passed. Then using the guide for a small posterior resection off  the posterior aspect of the femur was positioned over the mid portion of the medial femoral condyle.  The orientation was set using the guide that mates the femoral guide to the intramedullary rod.  The 2 drill holes were made into the distal  femur.  The posterior guide was then impacted into place and the posterior  femoral cut made.  At this point, I milled the distal femur with a size 4 spigot in place. At this point, we did a trial reduction of the small femur, size AA tibial tray and a size 4 feeler gauge. At 90 degrees of  flexion and at 20 degrees of flexion the knee had symmetric tension on  the  ligaments.   Given these findings, the trial femoral component was removed. Final preparation of tibia was carried out by pinning it in position. Then  using a reciprocating saw I removed bone for the keel. Further bone was  removed with an osteotome.  Trial reduction was now carried out with the small femur, the AA keeled tibia, and a 4 lollipop insert. The balance of the  ligaments appeared to be symmetric at 20 degrees and 90 degrees. Given  all these findings, the trial components were removed.   Cement was mixed. The final components were opened. The knee was irrigated with  normal saline solution. Then final debridements of the  soft tissue was carried out, I also drilled the sclerotic bone with a drill.  The final components were cemented with a single batch of cement in a  two-stage technique with the tibial component cemented first. The knee  was then brought  to 45 degrees of flexion with a 4 feeler gauge, held with pressure for a minute and half.  After this the femoral component was cemented in place.  The knee was again held at 45 degrees of flexion while the cement fully cured.  Excess cement was removed throughout the knee. Tourniquet was let down  after 30 minutes. After the cement had fully cured and excessive cement  was removed throughout the knee there was no visualized cement present.   The final size 4 small left medial insert was chosen and snapped into position. We re-irrigated  the knee. I placed a medium Hemovac drain deep. The extensor mechanism  was then reapproximated using a #1 Vicryl with the knee in flexion. The  remaining wound was closed with 2-0 Vicryl and a running 4-0 Monocryl.  The knee was cleaned, dried, and dressed sterilely using Dermabond and  Aquacel dressing. The drain site was dressed separately. The patient  was brought to the recovery room, Ace wrap in place, tolerating the  procedure well. He will be in the hospital for overnight  observation.  We will initiate physical therapy and progress to ambulate.     Madlyn Frankel Charlann Boxer, M.D.

## 2012-08-05 NOTE — Transfer of Care (Signed)
Immediate Anesthesia Transfer of Care Note  Patient: Penny Morgan  Procedure(s) Performed: Procedure(s) (LRB) with comments: UNICOMPARTMENTAL KNEE (Left)  Patient Location: PACU  Anesthesia Type:Regional and Spinal  Level of Consciousness: awake, oriented and patient cooperative  Airway & Oxygen Therapy: Patient Spontanous Breathing and Patient connected to face mask oxygen  Post-op Assessment: Report given to PACU RN and Post -op Vital signs reviewed and stable  Post vital signs: Reviewed and stable  Complications: No apparent anesthesia complications

## 2012-08-05 NOTE — Anesthesia Procedure Notes (Signed)
Spinal  Patient location during procedure: OR Start time: 08/05/2012 1:16 PM End time: 08/05/2012 1:21 PM Staffing Performed by: resident/CRNA  Preanesthetic Checklist Completed: patient identified, site marked, surgical consent, pre-op evaluation, timeout performed, IV checked, risks and benefits discussed and monitors and equipment checked Spinal Block Patient position: sitting Prep: Betadine Patient monitoring: heart rate, cardiac monitor, continuous pulse ox and blood pressure Approach: midline Location: L3-4 Injection technique: single-shot Needle Needle type: Spinocan  Needle gauge: 22 G Needle length: 9 cm Needle insertion depth: 6 cm Assessment Sensory level: T8

## 2012-08-05 NOTE — Anesthesia Postprocedure Evaluation (Signed)
  Anesthesia Post-op Note  Patient: Penny Morgan  Procedure(s) Performed: Procedure(s) (LRB): UNICOMPARTMENTAL KNEE (Left)  Patient Location: PACU  Anesthesia Type: General  Level of Consciousness: awake and alert   Airway and Oxygen Therapy: Patient Spontanous Breathing  Post-op Pain: mild  Post-op Assessment: Post-op Vital signs reviewed, Patient's Cardiovascular Status Stable, Respiratory Function Stable, Patent Airway and No signs of Nausea or vomiting  Last Vitals:  Filed Vitals:   08/05/12 1608  BP: 115/74  Pulse: 77  Temp: 36.3 C  Resp: 18    Post-op Vital Signs: stable   Complications: No apparent anesthesia complications

## 2012-08-06 DIAGNOSIS — E669 Obesity, unspecified: Secondary | ICD-10-CM | POA: Diagnosis present

## 2012-08-06 DIAGNOSIS — D500 Iron deficiency anemia secondary to blood loss (chronic): Secondary | ICD-10-CM | POA: Diagnosis not present

## 2012-08-06 LAB — CBC
HCT: 32.3 % — ABNORMAL LOW (ref 36.0–46.0)
Hemoglobin: 10.9 g/dL — ABNORMAL LOW (ref 12.0–15.0)
MCH: 30.8 pg (ref 26.0–34.0)
MCHC: 33.7 g/dL (ref 30.0–36.0)
MCV: 91.2 fL (ref 78.0–100.0)
Platelets: 202 10*3/uL (ref 150–400)
RBC: 3.54 MIL/uL — ABNORMAL LOW (ref 3.87–5.11)
RDW: 12.5 % (ref 11.5–15.5)
WBC: 8.5 10*3/uL (ref 4.0–10.5)

## 2012-08-06 LAB — BASIC METABOLIC PANEL
BUN: 10 mg/dL (ref 6–23)
CO2: 25 mEq/L (ref 19–32)
Calcium: 8 mg/dL — ABNORMAL LOW (ref 8.4–10.5)
Chloride: 103 mEq/L (ref 96–112)
Creatinine, Ser: 0.95 mg/dL (ref 0.50–1.10)
GFR calc Af Amer: 71 mL/min — ABNORMAL LOW (ref >90)
GFR calc non Af Amer: 62 mL/min — ABNORMAL LOW (ref >90)
Glucose, Bld: 124 mg/dL — ABNORMAL HIGH (ref 70–99)
Potassium: 4.1 mEq/L (ref 3.5–5.1)
Sodium: 136 mEq/L (ref 135–145)

## 2012-08-06 MED ORDER — POLYETHYLENE GLYCOL 3350 17 G PO PACK: 17.0000 g | PACK | Freq: Two times a day (BID) | ORAL | Status: DC

## 2012-08-06 MED ORDER — BLISTEX EX OINT
TOPICAL_OINTMENT | CUTANEOUS | Status: AC
  Administered 2012-08-06: 01:00:00
  Filled 2012-08-06: qty 10

## 2012-08-06 MED ORDER — DIPHENHYDRAMINE HCL 25 MG PO CAPS: 25.0000 mg | ORAL_CAPSULE | Freq: Four times a day (QID) | ORAL | Status: DC | PRN

## 2012-08-06 MED ORDER — METHOCARBAMOL 500 MG PO TABS: 500.0000 mg | ORAL_TABLET | Freq: Four times a day (QID) | ORAL | Status: DC | PRN

## 2012-08-06 MED ORDER — FERROUS SULFATE 325 (65 FE) MG PO TABS: 325.0000 mg | ORAL_TABLET | Freq: Three times a day (TID) | ORAL | Status: DC

## 2012-08-06 MED ORDER — ASPIRIN EC 81 MG PO TBEC: 81.0000 mg | DELAYED_RELEASE_TABLET | Freq: Every morning | ORAL | Status: DC

## 2012-08-06 MED ORDER — RIVAROXABAN 10 MG PO TABS: 10.0000 mg | ORAL_TABLET | ORAL | Status: DC

## 2012-08-06 MED ORDER — HYDROCODONE-ACETAMINOPHEN 7.5-325 MG PO TABS: 1.0000 | ORAL_TABLET | ORAL | Status: DC | PRN

## 2012-08-06 MED ORDER — DSS 100 MG PO CAPS: 100.0000 mg | ORAL_CAPSULE | Freq: Two times a day (BID) | ORAL | Status: DC

## 2012-08-06 NOTE — Progress Notes (Signed)
Pt discharged to family auto via wheel chair. Assessment unchanged from am.

## 2012-08-06 NOTE — Evaluation (Signed)
Physical Therapy Evaluation Patient Details Name: Penny Morgan MRN: [id number] DOB: [birth year] Today's Date: 08/06/2012 Time: 0927-1010 PT Time Calculation (min): 43 min  PT Assessment / Plan / Recommendation Clinical Impression  Pt s/p L UKR presents with decreased L LE strength/ROM and post op pain limiting functional mobility    PT Assessment  Patient needs continued PT services    Follow Up Recommendations  Home health PT    Does the patient have the potential to tolerate intense rehabilitation      Barriers to Discharge None      Equipment Recommendations  None recommended by PT    Recommendations for Other Services     Frequency 7X/week    Precautions / Restrictions Precautions Precautions: Knee Required Braces or Orthoses: Knee Immobilizer - Left Knee Immobilizer - Left: Discontinue once straight leg raise with < 10 degree lag (Pt performed IND SLR this date) Restrictions Weight Bearing Restrictions: No Other Position/Activity Restrictions: WBAT   Pertinent Vitals/Pain 2/10; premedicated; cold packs provided      Mobility  Bed Mobility Bed Mobility: Supine to Sit;Sit to Supine Supine to Sit: 5: Supervision Sit to Supine: 5: Supervision Transfers Transfers: Sit to Stand;Stand to Sit Sit to Stand: 5: Supervision Stand to Sit: 5: Supervision Details for Transfer Assistance: cues for use of UEs and for LE management Ambulation/Gait Ambulation/Gait Assistance: 5: Supervision Ambulation Distance (Feet): 150 Feet Assistive device: Standard walker Ambulation/Gait Assistance Details: min cues for posture, sequence and position from RW Gait Pattern: Step-to pattern;Step-through pattern Gait velocity: steady Stairs: Yes Stairs Assistance: 4: Min assist Stairs Assistance Details (indicate cue type and reason): cues for sequence and for foot/SW placement Stair Management Technique: No rails;Backwards;Forwards;With walker Number of Stairs: 1  (twice)      Shoulder Instructions     Exercises Total Joint Exercises Ankle Circles/Pumps: AROM;Both;10 reps;Supine Quad Sets: AROM;Both;10 reps;Supine Heel Slides: AAROM;15 reps;Supine;Left Straight Leg Raises: AROM;10 reps;Supine;Left   PT Diagnosis: Difficulty walking  PT Problem List: Decreased strength;Decreased range of motion;Decreased activity tolerance;Decreased mobility;Decreased knowledge of use of DME;Pain PT Treatment Interventions: DME instruction;Gait training;Stair training;Functional mobility training;Therapeutic activities;Therapeutic exercise;Patient/family education   PT Goals    Visit Information  Last PT Received On: 08/06/12 Assistance Needed: +1    Subjective Data  Subjective: I'm ready to go but I really need the bathroom first Patient Stated Goal: Resume previous active lifestyle with decreased pain   Prior Functioning  Home Living Lives With: Spouse Available Help at Discharge: Family Type of Home: House Home Access: Stairs to enter Secretary/administrator of Steps: 1+1 Entrance Stairs-Rails: None Home Layout: One level Home Adaptive Equipment: Walker - rolling;Walker - Curator - four wheeled Prior Function Level of Independence: Independent Able to Take Stairs?: Yes Driving: Yes Vocation: Retired Musician: No difficulties    Cognition  Overall Cognitive Status: Appears within functional limits for tasks assessed/performed Arousal/Alertness: Awake/alert Orientation Level: Appears intact for tasks assessed Behavior During Session: [facility] for tasks performed    Extremity/Trunk Assessment Right Upper Extremity Assessment RUE ROM/Strength/Tone: [facility] for tasks assessed Left Upper Extremity Assessment LUE ROM/Strength/Tone: WFL for tasks assessed Right Lower Extremity Assessment RLE ROM/Strength/Tone: [facility] for tasks assessed Left Lower Extremity Assessment LLE ROM/Strength/Tone: Deficits LLE ROM/Strength/Tone Deficits: 3/5 quads  with AAROM at knee -5 - 105   Balance    End of Session PT - End of Session Activity Tolerance: Patient tolerated treatment well Patient left: in bed;with call bell/phone within reach Nurse Communication: Mobility status  GP  Judy Pollman 08/06/2012, 12:00 PM

## 2012-08-06 NOTE — Care Management Note (Signed)
    Page 1 of 1   08/06/2012     5:58:09 PM   CARE MANAGEMENT NOTE 08/06/2012  Patient:  Penny Morgan, Penny Morgan   Account Number:  [id number]  Date Initiated:  08/06/2012  Documentation initiated by:  Colleen Can  Subjective/Objective Assessment:   DX LEFT KNEE MEDIAL COMPARTMENT OSTEOARTHRITIS; LEFT PARTIAL KNEE REPLACEMNT     Action/Plan:   CM spoke with patient and plans are for her to return to her home in [place] where spouse will be caregiver. She aqlready has RW. Wants HH agency in network.   Anticipated DC Date:  08/06/2012   Anticipated DC Plan:  HOME W HOME HEALTH SERVICES  In-house referral  Clinical Social Worker      DC Planning Services  CM consult      Choice offered to / List presented to:  C-1 Patient        HH arranged  HH-2 PT      [facility] agency  [facility]   Status of service:  Completed, signed off Medicare Important Message given?  NA - LOS <3 / Initial given by admissions (If response is "NO", the following Medicare IM given date fields will be blank) Date Medicare IM given:   Date Additional Medicare IM given:    Discharge Disposition:  HOME W HOME HEALTH SERVICES  Per UR Regulation:    If discussed at Long Length of Stay Meetings, dates discussed:    Comments:  08/06/2012 Colleen Can BSN RN CCM [phone] [facility] in [place] was contacted and Arline Asp in intake requested information via fax-[phone]. Services can be started within 48hrs hours. Information faxed with confirmation.

## 2012-08-06 NOTE — Progress Notes (Signed)
   Subjective: 1 Day Post-Op Procedure(s) (LRB): UNICOMPARTMENTAL KNEE (Left)   Patient reports pain as mild, pain well controlled. No events throughout the night. Feels that the knee is doing well. Ready to be discharged home if she does well with PT.  Objective:   VITALS:   Filed Vitals:   08/06/12 0524  BP: 110/67  Pulse: 81  Temp: 98.3 F (36.8 C)  Resp: 14    Neurovascular intact Dorsiflexion/Plantar flexion intact Incision: dressing C/D/I No cellulitis present Compartment soft  LABS  Basename 08/06/12 0449  HGB 10.9*  HCT 32.3*  WBC 8.5  PLT 202     Basename 08/06/12 0449  NA 136  K 4.1  BUN 10  CREATININE 0.95  GLUCOSE 124*     Assessment/Plan: 1 Day Post-Op Procedure(s) (LRB): UNICOMPARTMENTAL KNEE (Left) HV drain d/c'ed Foley cath d/c'ed Advance diet Up with therapy D/C IV fluids Discharge home with home health Follow up in 2 weeks at [facility]. Follow up with OLIN,Weslie Rasmus D in 2 weeks.  Contact information:  [facility] [address] [place] [address] [phone]     Expected ABLA  Treated with iron and will observe  Obese (BMI 30-39.9)  Estimated Body mass index is 30.81 kg/(m^2) as calculated from the following:   Height as of this encounter: 5' 5.5"(1.664 m).   Weight as of this encounter: 188 lb(85.276 kg). Patient also counseled that weight may inhibit the healing process Patient counseled that losing weight will help with future health issues      Anastasio Auerbach. Chessica Audia   PAC  08/06/2012, 8:37 AM

## 2012-08-06 NOTE — Progress Notes (Signed)
OT Note:  Pt screened for OT.  She has a high toilet and shower stall at home.  [place], OTR/L [phone] 08/06/2012

## 2012-08-06 NOTE — Progress Notes (Signed)
Utilization review completed.  

## 2012-08-07 DIAGNOSIS — R262 Difficulty in walking, not elsewhere classified: Secondary | ICD-10-CM | POA: Diagnosis not present

## 2012-08-07 DIAGNOSIS — M1990 Unspecified osteoarthritis, unspecified site: Secondary | ICD-10-CM | POA: Diagnosis not present

## 2012-08-07 DIAGNOSIS — IMO0001 Reserved for inherently not codable concepts without codable children: Secondary | ICD-10-CM | POA: Diagnosis not present

## 2012-08-07 NOTE — Discharge Summary (Signed)
Physician Discharge Summary  Patient ID: Penny Morgan MRN: [id number] DOB/AGE: 1946-06-26 67 y.o.  Admit date: 08/05/2012 Discharge date: 08/06/2012   Procedures:  Procedure(s) (LRB): UNICOMPARTMENTAL KNEE (Left)  Attending Physician:  Dr. Durene Romans   Admission Diagnoses:   Left knee OA / pain  Discharge Diagnoses:  Principal Problem:  *S/P left UKA Active Problems:  Expected blood loss anemia  Obese Chronic kidney disease - STAGE III KIDNEY DISEASE   Hypertension   Chest congestion   Headache - MIGRAINES   Arthritis   Carpal tunnel syndrome   Melanoma - 2 YRS AGO   GERD (gastroesophageal reflux disease)   Difficulty sleeping   Depression   Anxiety   HPI: Penny Morgan, 67 y.o. female, has a history of pain and functional disability in the left knee due to arthritis and has failed non-surgical conservative treatments for greater than 12 weeks to includeNSAID's and/or analgesics, corticosteriod injections, viscosupplementation injections and activity modification. Onset of symptoms was gradual, starting 4 years ago with gradually worsening course since that time. The patient noted prior procedures on the knee to include arthroscopy on the left knee(s). Patient currently rates pain in the left knee(s) at 10 out of 10 with activity. Patient has night pain, worsening of pain with activity and weight bearing, pain that interferes with activities of daily living, pain with passive range of motion, crepitus and joint swelling. Patient has evidence of periarticular osteophytes and joint space narrowing of the medial compartment by imaging studies. There is no active infection. Risks, benefits and expectations were discussed with the patient. Patient understand the risks, benefits and expectations and wishes to proceed with surgery.   PCP: Encarnacion Slates, PA   Discharged Condition: good  Hospital Course:  Patient underwent the above stated procedure on 08/05/2012. Patient  tolerated the procedure well and brought to the recovery room in good condition and subsequently to the floor.  POD #1 BP: 110/67 ; Pulse: 81 ; Temp: 98.3 F (36.8 C) ; Resp: 14 Pt's foley was removed, as well as the hemovac drain removed. IV was changed to a saline lock. Patient reports pain as mild, pain well controlled. No events throughout the night. Feels that the knee is doing well. Ready to be discharged home if she does well with PT. Neurovascular intact, dorsiflexion/plantar flexion intact, incision: dressing C/D/I, no cellulitis present and compartment soft.   LABS  Basename  08/06/12 0449   HGB  10.9  HCT  32.3    Discharge Exam: General appearance: alert, cooperative and no distress Extremities: Homans sign is negative, no sign of DVT, no edema, redness or tenderness in the calves or thighs and no ulcers, gangrene or trophic changes  Disposition:   Home or Self Care with follow up in 2 weeks   Follow-up Information    Follow up with Shelda Pal, MD. In 2 weeks.   Contact information:   [address] [place] [address] [phone]          Discharge Orders    Future Orders Please Complete By Expires   Diet - low sodium heart healthy      Call MD / Call 911      Comments:   If you experience chest pain or shortness of breath, CALL 911 and be transported to the hospital emergency room.  If you develope a fever above 101 F, pus (white drainage) or increased drainage or redness at the wound, or calf pain, call your  surgeon's office.   Discharge instructions      Comments:   Maintain surgical dressing for 10-14 days, then replace with gauze and tape. Keep the area dry and clean until follow up. Follow up in 2 weeks at [facility]. Call with any questions or concerns.   Constipation Prevention      Comments:   Drink plenty of fluids.  Prune juice may be helpful.  You may use a stool softener, such as Colace (over the counter) 100 mg  twice a day.  Use MiraLax (over the counter) for constipation as needed.   Increase activity slowly as tolerated      Weight bearing as tolerated      TED hose      Comments:   Use stockings (TED hose) for 2 weeks on both leg(s).  You may remove them at night for sleeping.   Change dressing      Comments:   Maintain surgical dressing for 10-14 days, then change the dressing daily with sterile 4 x 4 inch gauze dressing and tape. Keep the area dry and clean.      Discharge Medication List as of 08/06/2012 10:00 AM    START taking these medications   Details  diphenhydrAMINE (BENADRYL) 25 mg capsule Take 1 capsule (25 mg total) by mouth every 6 (six) hours as needed for itching, allergies or sleep., Starting 08/06/2012, Until Discontinued, No Print    docusate sodium 100 MG CAPS Take 100 mg by mouth 2 (two) times daily., Starting 08/06/2012, Until Discontinued, No Print    ferrous sulfate 325 (65 FE) MG tablet Take 1 tablet (325 mg total) by mouth 3 (three) times daily after meals., Starting 08/06/2012, Until Discontinued, No Print    HYDROcodone-acetaminophen (NORCO) 7.5-325 MG per tablet Take 1-2 tablets by mouth every 4 (four) hours as needed for pain., Starting 08/06/2012, Until Discontinued, Print    methocarbamol (ROBAXIN) 500 MG tablet Take 1 tablet (500 mg total) by mouth every 6 (six) hours as needed., Starting 08/06/2012, Until Discontinued, No Print    polyethylene glycol (MIRALAX / GLYCOLAX) packet Take 17 g by mouth 2 (two) times daily., Starting 08/06/2012, Until Discontinued, No Print    rivaroxaban (XARELTO) 10 MG TABS tablet Take 1 tablet (10 mg total) by mouth daily., Starting 08/06/2012, Until Discontinued, No Print      CONTINUE these medications which have CHANGED   Details  aspirin EC 81 MG tablet Take 1 tablet (81 mg total) by mouth every morning., Starting 08/06/2012, Until Discontinued, No Print      CONTINUE these medications which have NOT CHANGED   Details  atorvastatin  (LIPITOR) 20 MG tablet Take 20 mg by mouth every morning., Until Discontinued, Historical Med    estradiol (ESTRACE) 1 MG tablet Take 0.5 mg by mouth at bedtime., Until Discontinued, Historical Med    fluticasone (FLONASE) 50 MCG/ACT nasal spray Place 2 sprays into the nose daily as needed. For allergies, Until Discontinued, Historical Med    furosemide (LASIX) 20 MG tablet Take 20 mg by mouth daily as needed. For swelling, Until Discontinued, Historical Med    !! levothyroxine (SYNTHROID, LEVOTHROID) 100 MCG tablet Take 100 mcg by mouth every other day., Until Discontinued, Historical Med    !! levothyroxine (SYNTHROID, LEVOTHROID) 88 MCG tablet Take 88 mcg by mouth every other day., Until Discontinued, Historical Med    LORazepam (ATIVAN) 1 MG tablet Take 1 mg by mouth every 8 (eight) hours as needed., Until Discontinued, Historical Med  medroxyPROGESTERone (PROVERA) 2.5 MG tablet Take 2.5 mg by mouth at bedtime., Until Discontinued, Historical Med    montelukast (SINGULAIR) 10 MG tablet Take 10 mg by mouth every morning., Until Discontinued, Historical Med    olmesartan (BENICAR) 20 MG tablet Take 20 mg by mouth every morning., Until Discontinued, Historical Med    Omeprazole Magnesium 20.6 (20 BASE) MG CPDR Take 20.6 mg by mouth daily., Until Discontinued, Historical Med    polycarbophil (FIBERCON) 625 MG tablet Take 625 mg by mouth daily as needed., Until Discontinued, Historical Med    Potassium 99 MG TABS Take 99 mg by mouth daily., Until Discontinued, Historical Med    venlafaxine XR (EFFEXOR-XR) 75 MG 24 hr capsule Take 75 mg by mouth every morning., Until Discontinued, Historical Med    Cholecalciferol (VITAMIN D) 2000 UNITS tablet Take 2,000 Units by mouth daily., Until Discontinued, Historical Med    Multiple Vitamin (MULTIVITAMIN WITH MINERALS) TABS Take 1 tablet by mouth daily., Until Discontinued, Historical Med    rizatriptan (MAXALT-MLT) 10 MG disintegrating tablet  Take 10 mg by mouth as needed. May repeat in 2 hours if needed for migraine, Until Discontinued, Historical Med    zolpidem (AMBIEN) 10 MG tablet Take 10 mg by mouth at bedtime as needed. For sleep, Until Discontinued, Historical Med     !! - Potential duplicate medications found. Please discuss with provider.    STOP taking these medications     acetaminophen (TYLENOL) 500 MG tablet Comments:  Reason for Stopping:       traMADol (ULTRAM) 50 MG tablet Comments:  Reason for Stopping:           Signed: Anastasio Auerbach. Traxton Kolenda   PAC  08/07/2012, 9:00 AM

## 2012-08-08 ENCOUNTER — Encounter (HOSPITAL_COMMUNITY): Payer: Self-pay | Admitting: Orthopedic Surgery

## 2012-08-08 DIAGNOSIS — R262 Difficulty in walking, not elsewhere classified: Secondary | ICD-10-CM | POA: Diagnosis not present

## 2012-08-08 DIAGNOSIS — IMO0001 Reserved for inherently not codable concepts without codable children: Secondary | ICD-10-CM | POA: Diagnosis not present

## 2012-08-08 DIAGNOSIS — M1990 Unspecified osteoarthritis, unspecified site: Secondary | ICD-10-CM | POA: Diagnosis not present

## 2012-08-12 DIAGNOSIS — R262 Difficulty in walking, not elsewhere classified: Secondary | ICD-10-CM | POA: Diagnosis not present

## 2012-08-12 DIAGNOSIS — M1990 Unspecified osteoarthritis, unspecified site: Secondary | ICD-10-CM | POA: Diagnosis not present

## 2012-08-12 DIAGNOSIS — IMO0001 Reserved for inherently not codable concepts without codable children: Secondary | ICD-10-CM | POA: Diagnosis not present

## 2012-08-13 DIAGNOSIS — IMO0001 Reserved for inherently not codable concepts without codable children: Secondary | ICD-10-CM | POA: Diagnosis not present

## 2012-08-13 DIAGNOSIS — M1990 Unspecified osteoarthritis, unspecified site: Secondary | ICD-10-CM | POA: Diagnosis not present

## 2012-08-13 DIAGNOSIS — R262 Difficulty in walking, not elsewhere classified: Secondary | ICD-10-CM | POA: Diagnosis not present

## 2012-08-15 DIAGNOSIS — R262 Difficulty in walking, not elsewhere classified: Secondary | ICD-10-CM | POA: Diagnosis not present

## 2012-08-15 DIAGNOSIS — M1990 Unspecified osteoarthritis, unspecified site: Secondary | ICD-10-CM | POA: Diagnosis not present

## 2012-08-15 DIAGNOSIS — IMO0001 Reserved for inherently not codable concepts without codable children: Secondary | ICD-10-CM | POA: Diagnosis not present

## 2012-08-16 DIAGNOSIS — R262 Difficulty in walking, not elsewhere classified: Secondary | ICD-10-CM | POA: Diagnosis not present

## 2012-08-16 DIAGNOSIS — IMO0001 Reserved for inherently not codable concepts without codable children: Secondary | ICD-10-CM | POA: Diagnosis not present

## 2012-08-16 DIAGNOSIS — M1990 Unspecified osteoarthritis, unspecified site: Secondary | ICD-10-CM | POA: Diagnosis not present

## 2012-08-30 DIAGNOSIS — R7309 Other abnormal glucose: Secondary | ICD-10-CM | POA: Diagnosis not present

## 2012-08-30 DIAGNOSIS — E7800 Pure hypercholesterolemia, unspecified: Secondary | ICD-10-CM | POA: Diagnosis not present

## 2012-08-30 DIAGNOSIS — I10 Essential (primary) hypertension: Secondary | ICD-10-CM | POA: Diagnosis not present

## 2012-09-06 DIAGNOSIS — I10 Essential (primary) hypertension: Secondary | ICD-10-CM | POA: Diagnosis not present

## 2012-09-06 DIAGNOSIS — R7309 Other abnormal glucose: Secondary | ICD-10-CM | POA: Diagnosis not present

## 2012-09-06 DIAGNOSIS — E7800 Pure hypercholesterolemia, unspecified: Secondary | ICD-10-CM | POA: Diagnosis not present

## 2012-09-06 DIAGNOSIS — K219 Gastro-esophageal reflux disease without esophagitis: Secondary | ICD-10-CM | POA: Diagnosis not present

## 2012-09-06 DIAGNOSIS — G4700 Insomnia, unspecified: Secondary | ICD-10-CM | POA: Diagnosis not present

## 2012-09-06 DIAGNOSIS — E039 Hypothyroidism, unspecified: Secondary | ICD-10-CM | POA: Diagnosis not present

## 2012-10-02 DIAGNOSIS — Z471 Aftercare following joint replacement surgery: Secondary | ICD-10-CM | POA: Diagnosis not present

## 2012-10-09 DIAGNOSIS — T148 Other injury of unspecified body region: Secondary | ICD-10-CM | POA: Diagnosis not present

## 2012-10-09 DIAGNOSIS — D235 Other benign neoplasm of skin of trunk: Secondary | ICD-10-CM | POA: Diagnosis not present

## 2012-10-09 DIAGNOSIS — D239 Other benign neoplasm of skin, unspecified: Secondary | ICD-10-CM | POA: Diagnosis not present

## 2012-10-09 DIAGNOSIS — W57XXXA Bitten or stung by nonvenomous insect and other nonvenomous arthropods, initial encounter: Secondary | ICD-10-CM | POA: Diagnosis not present

## 2012-10-23 DIAGNOSIS — K219 Gastro-esophageal reflux disease without esophagitis: Secondary | ICD-10-CM | POA: Diagnosis not present

## 2012-10-23 DIAGNOSIS — I10 Essential (primary) hypertension: Secondary | ICD-10-CM | POA: Diagnosis not present

## 2012-10-23 DIAGNOSIS — J309 Allergic rhinitis, unspecified: Secondary | ICD-10-CM | POA: Diagnosis not present

## 2012-10-23 DIAGNOSIS — E039 Hypothyroidism, unspecified: Secondary | ICD-10-CM | POA: Diagnosis not present

## 2012-10-23 DIAGNOSIS — F341 Dysthymic disorder: Secondary | ICD-10-CM | POA: Diagnosis not present

## 2012-10-23 DIAGNOSIS — Z01419 Encounter for gynecological examination (general) (routine) without abnormal findings: Secondary | ICD-10-CM | POA: Diagnosis not present

## 2012-10-23 DIAGNOSIS — N951 Menopausal and female climacteric states: Secondary | ICD-10-CM | POA: Diagnosis not present

## 2012-10-31 DIAGNOSIS — Z1231 Encounter for screening mammogram for malignant neoplasm of breast: Secondary | ICD-10-CM | POA: Diagnosis not present

## 2012-11-27 DIAGNOSIS — M19049 Primary osteoarthritis, unspecified hand: Secondary | ICD-10-CM | POA: Diagnosis not present

## 2012-11-29 ENCOUNTER — Other Ambulatory Visit: Payer: Self-pay | Admitting: Orthopedic Surgery

## 2012-12-02 ENCOUNTER — Encounter (HOSPITAL_BASED_OUTPATIENT_CLINIC_OR_DEPARTMENT_OTHER): Payer: Self-pay | Admitting: *Deleted

## 2012-12-02 NOTE — Progress Notes (Signed)
Pt had total knee 1/14 did well-did not need to have any cardiac clearance-lives in [place] Will need istat-ekf cxr done 1/14

## 2012-12-04 DIAGNOSIS — Z23 Encounter for immunization: Secondary | ICD-10-CM | POA: Diagnosis not present

## 2012-12-04 DIAGNOSIS — E039 Hypothyroidism, unspecified: Secondary | ICD-10-CM | POA: Diagnosis not present

## 2012-12-04 DIAGNOSIS — R7309 Other abnormal glucose: Secondary | ICD-10-CM | POA: Diagnosis not present

## 2012-12-04 DIAGNOSIS — K219 Gastro-esophageal reflux disease without esophagitis: Secondary | ICD-10-CM | POA: Diagnosis not present

## 2012-12-04 DIAGNOSIS — G4700 Insomnia, unspecified: Secondary | ICD-10-CM | POA: Diagnosis not present

## 2012-12-04 DIAGNOSIS — E7800 Pure hypercholesterolemia, unspecified: Secondary | ICD-10-CM | POA: Diagnosis not present

## 2012-12-04 DIAGNOSIS — I10 Essential (primary) hypertension: Secondary | ICD-10-CM | POA: Diagnosis not present

## 2012-12-04 NOTE — H&P (Signed)
  Penny Morgan is an 67 y.o. female.   Chief Complaint: c/o chronic and progressive numbness and tingling of the right hand and chronic triggering of the right ing finger HPI: Penny Morgan returns for follow-up evaluation of her triggering of her right ring finger and right carpal tunnel symptoms. She has decided that she would like to proceed with release of her right transverse carpal ligament and release of her right ring finger A-1 pulley. She has tried splinting for almost 1 year but has progressive symptoms.   Past Medical History  Diagnosis Date  . Chronic kidney disease     STAGE III KIDNEY DISEASE  . Hypertension     TAKES BP MED "FOR KIDNEYS" HAS HAD ELEVATED BP IN GTHE PAST  . Chest congestion     CHRONIC  . Headache     MIGRAINES  . Arthritis   . Carpal tunnel syndrome   . Melanoma 2 YRS AGO  . GERD (gastroesophageal reflux disease)   . Difficulty sleeping   . Depression   . Anxiety   . Contact lens/glasses fitting     contacts or glasses    Past Surgical History  Procedure Laterality Date  . Knee arthroscopy      L KNEE  . Tonsillectomy    . Trigger finger release      L HAND  . Partial knee arthroplasty  08/05/2012    Procedure: UNICOMPARTMENTAL KNEE;  Surgeon: Shelda Pal, MD;  Location: [facility];  Service: Orthopedics;  Laterality: Left;  Marland Kitchen Melanoma excision  2011    back    History reviewed. No pertinent family history. Social History:  reports that she has been passively smoking.  She does not have any smokeless tobacco history on file. She reports that  drinks alcohol. She reports that she does not use illicit drugs.  Allergies:  Allergies  Allergen Reactions  . Oxycodone Nausea And Vomiting  . Sulfa Antibiotics Rash    Turned red on legs    No prescriptions prior to admission    No results found for this or any previous visit (from the past 48 hour(s)).  No results found.   Pertinent items are noted in HPI.  Height 5' 5.5" (1.664 m), weight  85.276 kg (188 lb).  General appearance: alert Head: Normocephalic, without obvious abnormality Neck: supple, symmetrical, trachea midline Resp: clear to auscultation bilaterally Cardio: regular rate and rhythm GI: normal findings: bowel sounds normal Extremities:Physical exam reveals active locking of her right ring finger at the A-1 pulley. She has a positive Phalen's and Tinel's. NCV test was positive for right CTS Pulses: 2+ and symmetric Skin: normal Neurologic: Grossly normal    Assessment/Plan Impression: Right CTS and STS right ring finger.  Plan: TO the OR for Right CTR and release right ring finger A-1 pulley. The procedure, risks,benefits and post-op course were discussed with the patient at length and they were in agreement with the plan.  DASNOIT,Penny Morgan 12/04/2012, 4:16 PM   H&P documentation: 12/05/2012  -History and Physical Reviewed  -Patient has been re-examined  -No change in the plan of care  Wyn Forster, MD

## 2012-12-05 ENCOUNTER — Encounter (HOSPITAL_BASED_OUTPATIENT_CLINIC_OR_DEPARTMENT_OTHER): Payer: Self-pay | Admitting: Certified Registered"

## 2012-12-05 ENCOUNTER — Ambulatory Visit (HOSPITAL_BASED_OUTPATIENT_CLINIC_OR_DEPARTMENT_OTHER)
Admission: RE | Admit: 2012-12-05 | Discharge: 2012-12-05 | Disposition: A | Discharge status: Home / Self Care | Payer: Medicare Other | Source: Ambulatory Visit | Attending: Orthopedic Surgery | Admitting: Orthopedic Surgery

## 2012-12-05 ENCOUNTER — Encounter (HOSPITAL_BASED_OUTPATIENT_CLINIC_OR_DEPARTMENT_OTHER)
Admission: RE | Disposition: A | Discharge status: Home / Self Care | Payer: Self-pay | Source: Ambulatory Visit | Attending: Orthopedic Surgery

## 2012-12-05 ENCOUNTER — Ambulatory Visit (HOSPITAL_BASED_OUTPATIENT_CLINIC_OR_DEPARTMENT_OTHER): Payer: Medicare Other | Admitting: Certified Registered"

## 2012-12-05 DIAGNOSIS — G479 Sleep disorder, unspecified: Secondary | ICD-10-CM | POA: Insufficient documentation

## 2012-12-05 DIAGNOSIS — G5600 Carpal tunnel syndrome, unspecified upper limb: Secondary | ICD-10-CM | POA: Insufficient documentation

## 2012-12-05 DIAGNOSIS — K219 Gastro-esophageal reflux disease without esophagitis: Secondary | ICD-10-CM | POA: Insufficient documentation

## 2012-12-05 DIAGNOSIS — Z885 Allergy status to narcotic agent status: Secondary | ICD-10-CM | POA: Diagnosis not present

## 2012-12-05 DIAGNOSIS — Z882 Allergy status to sulfonamides status: Secondary | ICD-10-CM | POA: Diagnosis not present

## 2012-12-05 DIAGNOSIS — Z85828 Personal history of other malignant neoplasm of skin: Secondary | ICD-10-CM | POA: Diagnosis not present

## 2012-12-05 DIAGNOSIS — N183 Chronic kidney disease, stage 3 unspecified: Secondary | ICD-10-CM | POA: Diagnosis not present

## 2012-12-05 DIAGNOSIS — M6530 Trigger finger, unspecified finger: Secondary | ICD-10-CM | POA: Diagnosis not present

## 2012-12-05 DIAGNOSIS — F411 Generalized anxiety disorder: Secondary | ICD-10-CM | POA: Insufficient documentation

## 2012-12-05 DIAGNOSIS — I129 Hypertensive chronic kidney disease with stage 1 through stage 4 chronic kidney disease, or unspecified chronic kidney disease: Secondary | ICD-10-CM | POA: Diagnosis not present

## 2012-12-05 DIAGNOSIS — F329 Major depressive disorder, single episode, unspecified: Secondary | ICD-10-CM | POA: Insufficient documentation

## 2012-12-05 DIAGNOSIS — F3289 Other specified depressive episodes: Secondary | ICD-10-CM | POA: Insufficient documentation

## 2012-12-05 HISTORY — PX: CARPAL TUNNEL RELEASE: SHX101

## 2012-12-05 HISTORY — PX: TRIGGER FINGER RELEASE: SHX641

## 2012-12-05 HISTORY — DX: Encounter for fitting and adjustment of spectacles and contact lenses: Z46.0

## 2012-12-05 LAB — POCT I-STAT, CHEM 8
BUN: 16 mg/dL (ref 6–23)
Calcium, Ion: 1.12 mmol/L — ABNORMAL LOW (ref 1.13–1.30)
Chloride: 105 mEq/L (ref 96–112)
Creatinine, Ser: 1 mg/dL (ref 0.50–1.10)
Glucose, Bld: 117 mg/dL — ABNORMAL HIGH (ref 70–99)
HCT: 46 % (ref 36.0–46.0)
Hemoglobin: 15.6 g/dL — ABNORMAL HIGH (ref 12.0–15.0)
Potassium: 3.7 mEq/L (ref 3.5–5.1)
Sodium: 140 mEq/L (ref 135–145)
TCO2: 24 mmol/L (ref 0–100)

## 2012-12-05 SURGERY — CARPAL TUNNEL RELEASE
Anesthesia: General | Site: Wrist | Laterality: Right | Wound class: Clean

## 2012-12-05 MED ORDER — LACTATED RINGERS IV SOLN
INTRAVENOUS | Status: DC
  Administered 2012-12-05 (×2): via INTRAVENOUS

## 2012-12-05 MED ORDER — HYDROMORPHONE HCL 2 MG PO TABS: 2.0000 mg | ORAL_TABLET | ORAL | Status: DC | PRN

## 2012-12-05 MED ORDER — HYDROMORPHONE HCL PF 1 MG/ML IJ SOLN: 0.2500 mg | INTRAMUSCULAR | Status: DC | PRN

## 2012-12-05 MED ORDER — LIDOCAINE HCL 2 % IJ SOLN
INTRAMUSCULAR | Status: DC | PRN
  Administered 2012-12-05: 3 mL

## 2012-12-05 MED ORDER — CEFAZOLIN SODIUM-DEXTROSE 2-3 GM-% IV SOLR: 2.0000 g | INTRAVENOUS | Status: DC

## 2012-12-05 MED ORDER — CHLORHEXIDINE GLUCONATE 4 % EX LIQD: 60.0000 mL | Freq: Once | CUTANEOUS | Status: DC

## 2012-12-05 MED ORDER — MIDAZOLAM HCL 5 MG/5ML IJ SOLN
INTRAMUSCULAR | Status: DC | PRN
  Administered 2012-12-05: 2 mg via INTRAVENOUS

## 2012-12-05 MED ORDER — DEXAMETHASONE SODIUM PHOSPHATE 4 MG/ML IJ SOLN
INTRAMUSCULAR | Status: DC | PRN
  Administered 2012-12-05: 10 mg via INTRAVENOUS

## 2012-12-05 MED ORDER — PROPOFOL 10 MG/ML IV BOLUS
INTRAVENOUS | Status: DC | PRN
  Administered 2012-12-05: 150 mg via INTRAVENOUS

## 2012-12-05 MED ORDER — ACETAMINOPHEN 325 MG PO TABS
650.0000 mg | ORAL_TABLET | Freq: Four times a day (QID) | ORAL | Status: DC | PRN
  Administered 2012-12-05: 650 mg via ORAL

## 2012-12-05 MED ORDER — LIDOCAINE HCL (CARDIAC) 20 MG/ML IV SOLN
INTRAVENOUS | Status: DC | PRN
  Administered 2012-12-05: 40 mg via INTRAVENOUS

## 2012-12-05 MED ORDER — FENTANYL CITRATE 0.05 MG/ML IJ SOLN
INTRAMUSCULAR | Status: DC | PRN
  Administered 2012-12-05 (×2): 50 ug via INTRAVENOUS

## 2012-12-05 SURGICAL SUPPLY — 41 items
BANDAGE ADHESIVE 1X3 (GAUZE/BANDAGES/DRESSINGS) IMPLANT
BANDAGE ELASTIC 3 VELCRO ST LF (GAUZE/BANDAGES/DRESSINGS) ×3 IMPLANT
BLADE SURG 15 STRL LF DISP TIS (BLADE) ×2 IMPLANT
BLADE SURG 15 STRL SS (BLADE) ×3
BNDG CMPR 9X4 STRL LF SNTH (GAUZE/BANDAGES/DRESSINGS) ×2
BNDG ESMARK 4X9 LF (GAUZE/BANDAGES/DRESSINGS) ×1 IMPLANT
BRUSH SCRUB EZ PLAIN DRY (MISCELLANEOUS) ×3 IMPLANT
CLOTH BEACON ORANGE TIMEOUT ST (SAFETY) ×3 IMPLANT
CORDS BIPOLAR (ELECTRODE) ×1 IMPLANT
COVER MAYO STAND STRL (DRAPES) ×3 IMPLANT
COVER TABLE BACK 60X90 (DRAPES) ×3 IMPLANT
CUFF TOURNIQUET SINGLE 18IN (TOURNIQUET CUFF) ×1 IMPLANT
DECANTER SPIKE VIAL GLASS SM (MISCELLANEOUS) IMPLANT
DRAPE EXTREMITY T 121X128X90 (DRAPE) ×3 IMPLANT
DRAPE SURG 17X23 STRL (DRAPES) ×3 IMPLANT
GLOVE BIO SURGEON STRL SZ 6.5 (GLOVE) ×1 IMPLANT
GLOVE BIOGEL M STRL SZ7.5 (GLOVE) ×2 IMPLANT
GLOVE EPREMIER NITRL EXT CFF L (GLOVE) IMPLANT
GLOVE EXAM NITRILE EXT CFF LRG (GLOVE) ×3 IMPLANT
GLOVE ORTHO TXT STRL SZ7.5 (GLOVE) ×3 IMPLANT
GOWN BRE IMP PREV XXLGXLNG (GOWN DISPOSABLE) ×5 IMPLANT
GOWN PREVENTION PLUS XLARGE (GOWN DISPOSABLE) ×3 IMPLANT
NEEDLE 27GAX1X1/2 (NEEDLE) ×1 IMPLANT
PACK BASIN DAY SURGERY FS (CUSTOM PROCEDURE TRAY) ×3 IMPLANT
PAD CAST 3X4 CTTN HI CHSV (CAST SUPPLIES) ×2 IMPLANT
PADDING CAST ABS 4INX4YD NS (CAST SUPPLIES) ×1
PADDING CAST ABS COTTON 4X4 ST (CAST SUPPLIES) ×2 IMPLANT
PADDING CAST COTTON 3X4 STRL (CAST SUPPLIES) ×3
SPLINT PLASTER CAST XFAST 3X15 (CAST SUPPLIES) ×10 IMPLANT
SPLINT PLASTER XTRA FASTSET 3X (CAST SUPPLIES) ×5
SPONGE GAUZE 4X4 12PLY (GAUZE/BANDAGES/DRESSINGS) ×2 IMPLANT
STOCKINETTE 4X48 STRL (DRAPES) ×3 IMPLANT
STRIP CLOSURE SKIN 1/2X4 (GAUZE/BANDAGES/DRESSINGS) ×3 IMPLANT
SUT ETHILON 5 0 P 3 18 (SUTURE)
SUT NYLON ETHILON 5-0 P-3 1X18 (SUTURE) IMPLANT
SUT PROLENE 3 0 PS 2 (SUTURE) ×3 IMPLANT
SUT PROLENE 4 0 P 3 18 (SUTURE) ×1 IMPLANT
SYR 3ML 23GX1 SAFETY (SYRINGE) IMPLANT
SYR CONTROL 10ML LL (SYRINGE) ×1 IMPLANT
TRAY DSU PREP LF (CUSTOM PROCEDURE TRAY) ×3 IMPLANT
UNDERPAD 30X30 INCONTINENT (UNDERPADS AND DIAPERS) ×3 IMPLANT

## 2012-12-05 NOTE — Brief Op Note (Signed)
12/05/2012  11:31 AM  PATIENT:  Penny Morgan  67 y.o. female  PRE-OPERATIVE DIAGNOSIS:  RIGHT CARPAL TUNNEL SYNDROME, CHRONIC STENOSING TENOSYNOVITIS RIGHT RING FINGER  POST-OPERATIVE DIAGNOSIS:  RIGHT CARPAL TUNNEL SYNDROME, CHRONIC STENOSING TENOSYNOVITIS RIGHT RING FINGER  PROCEDURE:  Procedure(s): CARPAL TUNNEL RELEASE (Right) RELEASE TRIGGER FINGER/A-1 PULLEY RIGHT RING FINGER (Right)  SURGEON:  Surgeon(s) and Role:    * Wyn Forster., MD - Primary  PHYSICIAN ASSISTANT:   ASSISTANTS: surgical technician  ANESTHESIA:   general  EBL:  Total I/O In: 1000 [I.V.:1000] Out: -   BLOOD ADMINISTERED:none  DRAINS: none   LOCAL MEDICATIONS USED:  LIDOCAINE   SPECIMEN:  No Specimen  DISPOSITION OF SPECIMEN:  N/A  COUNTS:  YES  TOURNIQUET:   Total Tourniquet Time Documented: Upper Arm (Right) - 16 minutes Total: Upper Arm (Right) - 16 minutes   DICTATION: .Other Dictation: Dictation Number [id number]  PLAN OF CARE: Discharge to home after PACU  PATIENT DISPOSITION:  PACU - hemodynamically stable.   Delay start of Pharmacological VTE agent (>24hrs) due to surgical blood loss or risk of bleeding: not applicable

## 2012-12-05 NOTE — Anesthesia Preprocedure Evaluation (Signed)
Anesthesia Evaluation  Patient identified by MRN, date of birth, ID band Patient awake    Reviewed: Allergy & Precautions, H&P , NPO status , Patient's Chart, lab work & pertinent test results  Airway Mallampati: II TM Distance: >3 FB Neck ROM: Full    Dental no notable dental hx. (+) Teeth Intact and Dental Advisory Given   Pulmonary neg pulmonary ROS,  breath sounds clear to auscultation  Pulmonary exam normal       Cardiovascular hypertension, On Medications Rhythm:Regular Rate:Normal     Neuro/Psych  Headaches, PSYCHIATRIC DISORDERS    GI/Hepatic Neg liver ROS, GERD-  Medicated and Controlled,  Endo/Other  negative endocrine ROS  Renal/GU Renal disease  negative genitourinary   Musculoskeletal   Abdominal   Peds  Hematology negative hematology ROS (+)   Anesthesia Other Findings   Reproductive/Obstetrics negative OB ROS                           Anesthesia Physical Anesthesia Plan  ASA: II  Anesthesia Plan: General   Post-op Pain Management:    Induction: Intravenous  Airway Management Planned: LMA  Additional Equipment:   Intra-op Plan:   Post-operative Plan: Extubation in OR  Informed Consent: I have reviewed the patients History and Physical, chart, labs and discussed the procedure including the risks, benefits and alternatives for the proposed anesthesia with the patient or authorized representative who has indicated his/her understanding and acceptance.   Dental advisory given  Plan Discussed with: CRNA  Anesthesia Plan Comments:         Anesthesia Quick Evaluation

## 2012-12-05 NOTE — Anesthesia Postprocedure Evaluation (Signed)
  Anesthesia Post-op Note  Patient: Penny Morgan  Procedure(s) Performed: Procedure(s): CARPAL TUNNEL RELEASE (Right) RELEASE TRIGGER FINGER/A-1 PULLEY RIGHT RING FINGER (Right)  Patient Location: PACU  Anesthesia Type:General  Level of Consciousness: awake, alert  and oriented  Airway and Oxygen Therapy: Patient Spontanous Breathing  Post-op Pain: none  Post-op Assessment: Post-op Vital signs reviewed  Post-op Vital Signs: Reviewed  Complications: No apparent anesthesia complications

## 2012-12-05 NOTE — Op Note (Signed)
[id number] 

## 2012-12-05 NOTE — Transfer of Care (Signed)
Immediate Anesthesia Transfer of Care Note  Patient: Penny Morgan  Procedure(s) Performed: Procedure(s): CARPAL TUNNEL RELEASE (Right) RELEASE TRIGGER FINGER/A-1 PULLEY RIGHT RING FINGER (Right)  Patient Location: PACU  Anesthesia Type:General  Level of Consciousness: sedated and patient cooperative  Airway & Oxygen Therapy: Patient Spontanous Breathing and Patient connected to face mask oxygen  Post-op Assessment: Report given to PACU RN and Post -op Vital signs reviewed and stable  Post vital signs: Reviewed and stable  Complications: No apparent anesthesia complications

## 2012-12-05 NOTE — Anesthesia Procedure Notes (Signed)
Procedure Name: LMA Insertion Date/Time: 12/05/2012 11:00 AM Performed by: Gar Gibbon Pre-anesthesia Checklist: Patient identified, Emergency Drugs available, Suction available and Patient being monitored Patient Re-evaluated:Patient Re-evaluated prior to inductionOxygen Delivery Method: Circle System Utilized Preoxygenation: Pre-oxygenation with 100% oxygen Intubation Type: IV induction Ventilation: Mask ventilation without difficulty LMA: LMA inserted LMA Size: 4.0 Number of attempts: 1 Airway Equipment and Method: bite block Placement Confirmation: positive ETCO2 Tube secured with: Tape Dental Injury: Teeth and Oropharynx as per pre-operative assessment

## 2012-12-06 ENCOUNTER — Encounter (HOSPITAL_BASED_OUTPATIENT_CLINIC_OR_DEPARTMENT_OTHER): Payer: Self-pay | Admitting: Orthopedic Surgery

## 2012-12-06 NOTE — Op Note (Signed)
NAME:  Penny Morgan, Penny Morgan             ACCOUNT NO.:  [id number]  MEDICAL RECORD NO.:  [id number]  LOCATION:                                 FACILITY:  PHYSICIAN:  Katy Fitch. Phebe Dettmer, M.D. DATE OF BIRTH:  [birth year]  DATE OF PROCEDURE:  12/05/2012 DATE OF DISCHARGE:                              OPERATIVE REPORT   PREOPERATIVE DIAGNOSES: 1. Chronic entrapment neuropathy of right median nerve at carpal     tunnel. 2. Chronic stenosing tenosynovitis, right ring finger at A1 pulley.  POSTOPERATIVE DIAGNOSES: 1. Chronic entrapment neuropathy of right median nerve at carpal     tunnel. 2. Chronic stenosing tenosynovitis, right ring finger at A1 pulley.  OPERATION: 1. Release of right transverse carpal ligament. 2. Release of right ring finger A1 pulley with incidental synovectomy.  OPERATING SURGEON:  Katy Fitch. Rushi Chasen, M.D.  ASSISTANT:  Surgical technician.  ANESTHESIA:  General by LMA.  SUPERVISING ANESTHESIOLOGIST:  Zenon Mayo, MD  INDICATIONS:  Mysti Morgan is a 67 year old woman referred for evaluation and management of chronic triggering of her right ring finger and chronic right hand numbness.  She has had a detailed evaluation in the office and electrodiagnostic studies confirming carpal tunnel syndrome.  She has failed nonoperative measures for trigger finger.  She is brought to the operating at this time for release of her right transverse carpal ligament and release of her right ring finger A1 pulley.  Preoperatively, she was reminded of the potential risks and benefits of surgery.  Questions were invited and answered in detail.  PROCEDURE:  Penny Morgan was brought to room 6 of the [facility] and placed in supine position on the operating table.  Following the induction of general anesthesia by LMA technique, the right hand and arm were prepped with Betadine soap and solution, sterilely draped with sterile stockinette and impervious  arthroscopy drapes.  Following exsanguination of the left arm with Esmarch bandage, an arterial tourniquet on the proximal brachium was inflated to 220 mmHg.  Following routine surgical time-out, the procedure commenced with a short transverse incision just distal to the distal palmar crease overlying the palpably thickened A1 pulley of the right ring finger.  Subcutaneous tissues were carefully divided taking care to identify the A1 and a small A0 pulley.  Both were released along the radial border with scissors.  The tendons delivered and a thick cuff of fibrotic tenosynovium was removed with a rongeur.  Thereafter, free range of motion of the ring finger was noted.  The wound was then repaired with intradermal 3-0 Prolene suture.  Attention was then directed to the proximal palm.  A short incision was fashioned in the line of the ring finger.  Subcutaneous tissues were carefully divided, taking care to identify the palmar fascia.  This was split longitudinally to the common sensory branch of the median nerve and superficial palmar arch.  The carpal canal was sounded with a Penfield 4 elevator, followed by release of the transverse carpal ligament along its ulnar border extending into the distal forearm.  This widely opened the carpal canal.  The volar forearm fascia was likewise released subcutaneously.  The wound was inspected with the aid of a  Sewall retractor.  The wound was then repaired with intradermal 3-0 Prolene suture.  A compressive dressing was applied with sterile gauze, sterile Webril, and a volar plaster splint maintaining the wrist in 20 degrees of dorsiflexion.  For aftercare, Penny Morgan has provided a prescription for Dilaudid 2 mg 1 or 2 tablets p.o. q.4-6 hours p.r.n. pain, 20 tablets without refill.     Katy Fitch Shaunn Tackitt, M.D.     RVS/MEDQ  D:  12/05/2012  T:  12/06/2012  Job:  [id number]

## 2012-12-11 DIAGNOSIS — G4700 Insomnia, unspecified: Secondary | ICD-10-CM | POA: Diagnosis not present

## 2012-12-11 DIAGNOSIS — K219 Gastro-esophageal reflux disease without esophagitis: Secondary | ICD-10-CM | POA: Diagnosis not present

## 2012-12-11 DIAGNOSIS — Z23 Encounter for immunization: Secondary | ICD-10-CM | POA: Diagnosis not present

## 2012-12-11 DIAGNOSIS — I10 Essential (primary) hypertension: Secondary | ICD-10-CM | POA: Diagnosis not present

## 2012-12-11 DIAGNOSIS — R7309 Other abnormal glucose: Secondary | ICD-10-CM | POA: Diagnosis not present

## 2012-12-11 DIAGNOSIS — N189 Chronic kidney disease, unspecified: Secondary | ICD-10-CM | POA: Diagnosis not present

## 2012-12-11 DIAGNOSIS — E7800 Pure hypercholesterolemia, unspecified: Secondary | ICD-10-CM | POA: Diagnosis not present

## 2012-12-11 DIAGNOSIS — E039 Hypothyroidism, unspecified: Secondary | ICD-10-CM | POA: Diagnosis not present

## 2013-01-15 DIAGNOSIS — M19049 Primary osteoarthritis, unspecified hand: Secondary | ICD-10-CM | POA: Diagnosis not present

## 2013-01-19 DIAGNOSIS — I10 Essential (primary) hypertension: Secondary | ICD-10-CM | POA: Diagnosis not present

## 2013-01-19 DIAGNOSIS — Z7982 Long term (current) use of aspirin: Secondary | ICD-10-CM | POA: Diagnosis not present

## 2013-01-19 DIAGNOSIS — Z882 Allergy status to sulfonamides status: Secondary | ICD-10-CM | POA: Diagnosis not present

## 2013-01-19 DIAGNOSIS — Z885 Allergy status to narcotic agent status: Secondary | ICD-10-CM | POA: Diagnosis not present

## 2013-01-19 DIAGNOSIS — Z85820 Personal history of malignant melanoma of skin: Secondary | ICD-10-CM | POA: Diagnosis not present

## 2013-01-19 DIAGNOSIS — K630 Abscess of intestine: Secondary | ICD-10-CM | POA: Diagnosis not present

## 2013-01-19 DIAGNOSIS — K5732 Diverticulitis of large intestine without perforation or abscess without bleeding: Secondary | ICD-10-CM | POA: Diagnosis not present

## 2013-01-19 DIAGNOSIS — Z79899 Other long term (current) drug therapy: Secondary | ICD-10-CM | POA: Diagnosis not present

## 2013-01-19 DIAGNOSIS — E785 Hyperlipidemia, unspecified: Secondary | ICD-10-CM | POA: Diagnosis present

## 2013-01-19 DIAGNOSIS — IMO0002 Reserved for concepts with insufficient information to code with codable children: Secondary | ICD-10-CM | POA: Diagnosis not present

## 2013-01-19 DIAGNOSIS — R109 Unspecified abdominal pain: Secondary | ICD-10-CM | POA: Diagnosis not present

## 2013-01-19 DIAGNOSIS — M1990 Unspecified osteoarthritis, unspecified site: Secondary | ICD-10-CM | POA: Diagnosis present

## 2013-01-19 DIAGNOSIS — E039 Hypothyroidism, unspecified: Secondary | ICD-10-CM | POA: Diagnosis present

## 2013-01-19 DIAGNOSIS — K5730 Diverticulosis of large intestine without perforation or abscess without bleeding: Secondary | ICD-10-CM | POA: Diagnosis not present

## 2013-01-19 DIAGNOSIS — K219 Gastro-esophageal reflux disease without esophagitis: Secondary | ICD-10-CM | POA: Diagnosis not present

## 2013-02-18 DIAGNOSIS — K5732 Diverticulitis of large intestine without perforation or abscess without bleeding: Secondary | ICD-10-CM | POA: Diagnosis not present

## 2013-03-06 DIAGNOSIS — Z86010 Personal history of colonic polyps: Secondary | ICD-10-CM | POA: Diagnosis not present

## 2013-03-06 DIAGNOSIS — K5732 Diverticulitis of large intestine without perforation or abscess without bleeding: Secondary | ICD-10-CM | POA: Diagnosis not present

## 2013-03-06 DIAGNOSIS — Z8249 Family history of ischemic heart disease and other diseases of the circulatory system: Secondary | ICD-10-CM | POA: Diagnosis not present

## 2013-03-06 DIAGNOSIS — Z885 Allergy status to narcotic agent status: Secondary | ICD-10-CM | POA: Diagnosis not present

## 2013-03-06 DIAGNOSIS — Z7982 Long term (current) use of aspirin: Secondary | ICD-10-CM | POA: Diagnosis not present

## 2013-03-06 DIAGNOSIS — Z882 Allergy status to sulfonamides status: Secondary | ICD-10-CM | POA: Diagnosis not present

## 2013-03-06 DIAGNOSIS — Z79899 Other long term (current) drug therapy: Secondary | ICD-10-CM | POA: Diagnosis not present

## 2013-03-11 DIAGNOSIS — E039 Hypothyroidism, unspecified: Secondary | ICD-10-CM | POA: Diagnosis not present

## 2013-03-11 DIAGNOSIS — K219 Gastro-esophageal reflux disease without esophagitis: Secondary | ICD-10-CM | POA: Diagnosis not present

## 2013-03-11 DIAGNOSIS — N189 Chronic kidney disease, unspecified: Secondary | ICD-10-CM | POA: Diagnosis not present

## 2013-03-11 DIAGNOSIS — I10 Essential (primary) hypertension: Secondary | ICD-10-CM | POA: Diagnosis not present

## 2013-03-11 DIAGNOSIS — E7800 Pure hypercholesterolemia, unspecified: Secondary | ICD-10-CM | POA: Diagnosis not present

## 2013-03-11 DIAGNOSIS — R7309 Other abnormal glucose: Secondary | ICD-10-CM | POA: Diagnosis not present

## 2013-03-14 DIAGNOSIS — I10 Essential (primary) hypertension: Secondary | ICD-10-CM | POA: Diagnosis not present

## 2013-03-14 DIAGNOSIS — N189 Chronic kidney disease, unspecified: Secondary | ICD-10-CM | POA: Diagnosis not present

## 2013-03-14 DIAGNOSIS — E039 Hypothyroidism, unspecified: Secondary | ICD-10-CM | POA: Diagnosis not present

## 2013-03-14 DIAGNOSIS — K219 Gastro-esophageal reflux disease without esophagitis: Secondary | ICD-10-CM | POA: Diagnosis not present

## 2013-03-14 DIAGNOSIS — G4700 Insomnia, unspecified: Secondary | ICD-10-CM | POA: Diagnosis not present

## 2013-03-14 DIAGNOSIS — E7800 Pure hypercholesterolemia, unspecified: Secondary | ICD-10-CM | POA: Diagnosis not present

## 2013-03-14 DIAGNOSIS — R7309 Other abnormal glucose: Secondary | ICD-10-CM | POA: Diagnosis not present

## 2013-04-24 DIAGNOSIS — L570 Actinic keratosis: Secondary | ICD-10-CM | POA: Diagnosis not present

## 2013-04-24 DIAGNOSIS — D485 Neoplasm of uncertain behavior of skin: Secondary | ICD-10-CM | POA: Diagnosis not present

## 2013-04-24 DIAGNOSIS — L910 Hypertrophic scar: Secondary | ICD-10-CM | POA: Diagnosis not present

## 2013-04-24 DIAGNOSIS — Z96659 Presence of unspecified artificial knee joint: Secondary | ICD-10-CM | POA: Diagnosis not present

## 2013-04-30 DIAGNOSIS — Z23 Encounter for immunization: Secondary | ICD-10-CM | POA: Diagnosis not present

## 2013-05-21 DIAGNOSIS — Z9889 Other specified postprocedural states: Secondary | ICD-10-CM | POA: Diagnosis not present

## 2013-05-21 DIAGNOSIS — K5732 Diverticulitis of large intestine without perforation or abscess without bleeding: Secondary | ICD-10-CM | POA: Diagnosis not present

## 2013-06-04 DIAGNOSIS — K219 Gastro-esophageal reflux disease without esophagitis: Secondary | ICD-10-CM | POA: Diagnosis not present

## 2013-06-04 DIAGNOSIS — R7309 Other abnormal glucose: Secondary | ICD-10-CM | POA: Diagnosis not present

## 2013-06-04 DIAGNOSIS — I10 Essential (primary) hypertension: Secondary | ICD-10-CM | POA: Diagnosis not present

## 2013-06-04 DIAGNOSIS — E039 Hypothyroidism, unspecified: Secondary | ICD-10-CM | POA: Diagnosis not present

## 2013-06-04 DIAGNOSIS — E7800 Pure hypercholesterolemia, unspecified: Secondary | ICD-10-CM | POA: Diagnosis not present

## 2013-06-04 DIAGNOSIS — N189 Chronic kidney disease, unspecified: Secondary | ICD-10-CM | POA: Diagnosis not present

## 2013-06-11 DIAGNOSIS — E7800 Pure hypercholesterolemia, unspecified: Secondary | ICD-10-CM | POA: Diagnosis not present

## 2013-06-11 DIAGNOSIS — R7309 Other abnormal glucose: Secondary | ICD-10-CM | POA: Diagnosis not present

## 2013-06-11 DIAGNOSIS — K219 Gastro-esophageal reflux disease without esophagitis: Secondary | ICD-10-CM | POA: Diagnosis not present

## 2013-06-11 DIAGNOSIS — G4700 Insomnia, unspecified: Secondary | ICD-10-CM | POA: Diagnosis not present

## 2013-06-11 DIAGNOSIS — E039 Hypothyroidism, unspecified: Secondary | ICD-10-CM | POA: Diagnosis not present

## 2013-06-11 DIAGNOSIS — Z23 Encounter for immunization: Secondary | ICD-10-CM | POA: Diagnosis not present

## 2013-06-11 DIAGNOSIS — I10 Essential (primary) hypertension: Secondary | ICD-10-CM | POA: Diagnosis not present

## 2013-06-11 DIAGNOSIS — N189 Chronic kidney disease, unspecified: Secondary | ICD-10-CM | POA: Diagnosis not present

## 2013-08-15 DIAGNOSIS — M19049 Primary osteoarthritis, unspecified hand: Secondary | ICD-10-CM | POA: Diagnosis not present

## 2013-09-01 DIAGNOSIS — E7800 Pure hypercholesterolemia, unspecified: Secondary | ICD-10-CM | POA: Diagnosis not present

## 2013-09-01 DIAGNOSIS — K219 Gastro-esophageal reflux disease without esophagitis: Secondary | ICD-10-CM | POA: Diagnosis not present

## 2013-09-01 DIAGNOSIS — R7309 Other abnormal glucose: Secondary | ICD-10-CM | POA: Diagnosis not present

## 2013-09-01 DIAGNOSIS — E039 Hypothyroidism, unspecified: Secondary | ICD-10-CM | POA: Diagnosis not present

## 2013-09-01 DIAGNOSIS — I10 Essential (primary) hypertension: Secondary | ICD-10-CM | POA: Diagnosis not present

## 2013-09-03 DIAGNOSIS — E7800 Pure hypercholesterolemia, unspecified: Secondary | ICD-10-CM | POA: Diagnosis not present

## 2013-09-08 DIAGNOSIS — Z79899 Other long term (current) drug therapy: Secondary | ICD-10-CM | POA: Diagnosis not present

## 2013-09-08 DIAGNOSIS — M899 Disorder of bone, unspecified: Secondary | ICD-10-CM | POA: Diagnosis not present

## 2013-09-08 DIAGNOSIS — Z7982 Long term (current) use of aspirin: Secondary | ICD-10-CM | POA: Diagnosis not present

## 2013-09-08 DIAGNOSIS — Z780 Asymptomatic menopausal state: Secondary | ICD-10-CM | POA: Diagnosis not present

## 2013-09-08 DIAGNOSIS — Z9181 History of falling: Secondary | ICD-10-CM | POA: Diagnosis not present

## 2013-09-08 DIAGNOSIS — M949 Disorder of cartilage, unspecified: Secondary | ICD-10-CM | POA: Diagnosis not present

## 2013-09-10 DIAGNOSIS — N189 Chronic kidney disease, unspecified: Secondary | ICD-10-CM | POA: Diagnosis not present

## 2013-09-10 DIAGNOSIS — K219 Gastro-esophageal reflux disease without esophagitis: Secondary | ICD-10-CM | POA: Diagnosis not present

## 2013-09-10 DIAGNOSIS — Z23 Encounter for immunization: Secondary | ICD-10-CM | POA: Diagnosis not present

## 2013-09-10 DIAGNOSIS — E7800 Pure hypercholesterolemia, unspecified: Secondary | ICD-10-CM | POA: Diagnosis not present

## 2013-09-10 DIAGNOSIS — G4700 Insomnia, unspecified: Secondary | ICD-10-CM | POA: Diagnosis not present

## 2013-09-10 DIAGNOSIS — I10 Essential (primary) hypertension: Secondary | ICD-10-CM | POA: Diagnosis not present

## 2013-09-10 DIAGNOSIS — R7309 Other abnormal glucose: Secondary | ICD-10-CM | POA: Diagnosis not present

## 2013-09-10 DIAGNOSIS — E039 Hypothyroidism, unspecified: Secondary | ICD-10-CM | POA: Diagnosis not present

## 2013-09-10 DIAGNOSIS — M2550 Pain in unspecified joint: Secondary | ICD-10-CM | POA: Diagnosis not present

## 2013-10-08 ENCOUNTER — Other Ambulatory Visit: Payer: Self-pay | Admitting: Orthopedic Surgery

## 2013-10-16 ENCOUNTER — Encounter (HOSPITAL_BASED_OUTPATIENT_CLINIC_OR_DEPARTMENT_OTHER): Payer: Self-pay | Admitting: *Deleted

## 2013-10-16 NOTE — Progress Notes (Signed)
Pt here last yr-from [place] Will need ekg-and maybe istat-will see if pcp got recent labs

## 2013-10-20 NOTE — H&P (Signed)
Penny Morgan is an 68 y.o. female.   Chief Complaint: c/o chronic and progressive left thumb CMC pain and STS of the left ring finger ERD:EYCXKG Morgan returns for follow up evaluation of her bilateral thumb CMC arthritis, triggering of her left ring finger.  Penny has been experiencing a nagging pain in her hands and nighttime numbness. She has had pain at her [facility] joints, left greater than right. Her pain is aggravated by pinch and grip.  She has had no prior history of trauma or recognized fracture.      Past Medical History  Diagnosis Date  . Chronic kidney disease     STAGE III KIDNEY DISEASE  . Hypertension     TAKES BP MED "FOR KIDNEYS" HAS HAD ELEVATED BP IN GTHE PAST  . Chest congestion     CHRONIC  . Headache(784.0)     MIGRAINES  . Arthritis   . Carpal tunnel syndrome   . Melanoma 2 YRS AGO  . GERD (gastroesophageal reflux disease)   . Difficulty sleeping   . Depression   . Anxiety   . Contact lens/glasses fitting     contacts or glasses    Past Surgical History  Procedure Laterality Date  . Knee arthroscopy      L KNEE  . Tonsillectomy    . Trigger finger release      L HAND  . Partial knee arthroplasty  08/05/2012    Procedure: UNICOMPARTMENTAL KNEE;  Surgeon: Mauri Pole, MD;  Location: [facility];  Service: Orthopedics;  Laterality: Left;  Marland Kitchen Melanoma excision  2011    back  . Carpal tunnel release Right 12/05/2012    Procedure: CARPAL TUNNEL RELEASE;  Surgeon: Cammie Sickle., MD;  Location: [facility];  Service: Orthopedics;  Laterality: Right;  . Trigger finger release Right 12/05/2012    Procedure: RELEASE TRIGGER FINGER/A-1 PULLEY RIGHT RING FINGER;  Surgeon: Cammie Sickle., MD;  Location: [facility];  Service: Orthopedics;  Laterality: Right;    History reviewed. No pertinent family history. Social History:  reports that she has been passively smoking.  She does not have any smokeless tobacco history on file.  She reports that she drinks alcohol. She reports that she does not use illicit drugs.  Allergies:  Allergies  Allergen Reactions  . Oxycodone Nausea And Vomiting  . Sulfa Antibiotics Rash    Turned red on legs    No prescriptions prior to admission    No results found for this or any previous visit (from the past 48 hour(s)).  No results found.   Pertinent items are noted in HPI.  There were no vitals taken for this visit.  General appearance: alert Head: Normocephalic, without obvious abnormality Neck: supple, symmetrical, trachea midline Resp: clear to auscultation bilaterally Cardio: WNL GI: normal findings: bowel sounds normal Extremities:  Inspection of her hands reveals no thenar atrophy.  Her sweat patterns and dermatoglyphics are preserved.  She has pain with CMC translation and grind. Full motion of the MP and IP joints. She has active triggering of the left ring finger at the A-1 pulley level. Xrays revealed end stage OA changes of the left thumb CMC joint. Pulses: 2+ and symmetric Skin: normal Neurologic: Grossly normal Nerve conduction in 2010 revealed mild left carpal tunnel syndrome.    Assessment/Plan Impression: Left ring finger STS and Left thumb end stage OA of the CMC joint left thumb.  There are symptoms compatible with episodic carpal  tunnel syndrome.  Plan: TO the OR for release A-1 pulley left ring finger and left hand trapezium excision with thumb knotted tendon interposition arthroplasty.  Carpal tunnel release is indicated.  The procedure, risks,benefits and post-op course were discussed with the patient at length and they were in agreement with the plan.  DASNOIT,Alva Kuenzel J 10/20/2013, 12:46 PM  H&P documentation: 10/21/2013  -History and Physical Reviewed  -Patient has been re-examined  -No change in the plan of care  Cammie Sickle, MD

## 2013-10-21 ENCOUNTER — Ambulatory Visit (HOSPITAL_BASED_OUTPATIENT_CLINIC_OR_DEPARTMENT_OTHER)
Admission: RE | Admit: 2013-10-21 | Discharge: 2013-10-21 | Disposition: A | Discharge status: Home / Self Care | Payer: Medicare Other | Source: Ambulatory Visit | Attending: Orthopedic Surgery | Admitting: Orthopedic Surgery

## 2013-10-21 ENCOUNTER — Encounter (HOSPITAL_BASED_OUTPATIENT_CLINIC_OR_DEPARTMENT_OTHER): Payer: Self-pay | Admitting: *Deleted

## 2013-10-21 ENCOUNTER — Ambulatory Visit (HOSPITAL_BASED_OUTPATIENT_CLINIC_OR_DEPARTMENT_OTHER): Payer: Medicare Other | Admitting: Anesthesiology

## 2013-10-21 ENCOUNTER — Encounter (HOSPITAL_BASED_OUTPATIENT_CLINIC_OR_DEPARTMENT_OTHER)
Admission: RE | Disposition: A | Discharge status: Home / Self Care | Payer: Self-pay | Source: Ambulatory Visit | Attending: Orthopedic Surgery

## 2013-10-21 ENCOUNTER — Encounter (HOSPITAL_BASED_OUTPATIENT_CLINIC_OR_DEPARTMENT_OTHER): Payer: Medicare Other | Admitting: Anesthesiology

## 2013-10-21 DIAGNOSIS — M19039 Primary osteoarthritis, unspecified wrist: Secondary | ICD-10-CM | POA: Diagnosis not present

## 2013-10-21 DIAGNOSIS — G43909 Migraine, unspecified, not intractable, without status migrainosus: Secondary | ICD-10-CM | POA: Insufficient documentation

## 2013-10-21 DIAGNOSIS — G4700 Insomnia, unspecified: Secondary | ICD-10-CM | POA: Diagnosis not present

## 2013-10-21 DIAGNOSIS — N183 Chronic kidney disease, stage 3 unspecified: Secondary | ICD-10-CM | POA: Insufficient documentation

## 2013-10-21 DIAGNOSIS — K219 Gastro-esophageal reflux disease without esophagitis: Secondary | ICD-10-CM | POA: Insufficient documentation

## 2013-10-21 DIAGNOSIS — M19049 Primary osteoarthritis, unspecified hand: Secondary | ICD-10-CM | POA: Insufficient documentation

## 2013-10-21 DIAGNOSIS — F411 Generalized anxiety disorder: Secondary | ICD-10-CM | POA: Insufficient documentation

## 2013-10-21 DIAGNOSIS — F329 Major depressive disorder, single episode, unspecified: Secondary | ICD-10-CM | POA: Insufficient documentation

## 2013-10-21 DIAGNOSIS — Z85820 Personal history of malignant melanoma of skin: Secondary | ICD-10-CM | POA: Insufficient documentation

## 2013-10-21 DIAGNOSIS — M6530 Trigger finger, unspecified finger: Secondary | ICD-10-CM | POA: Diagnosis not present

## 2013-10-21 DIAGNOSIS — F3289 Other specified depressive episodes: Secondary | ICD-10-CM | POA: Insufficient documentation

## 2013-10-21 DIAGNOSIS — M65849 Other synovitis and tenosynovitis, unspecified hand: Secondary | ICD-10-CM

## 2013-10-21 DIAGNOSIS — G5600 Carpal tunnel syndrome, unspecified upper limb: Secondary | ICD-10-CM | POA: Insufficient documentation

## 2013-10-21 DIAGNOSIS — M65839 Other synovitis and tenosynovitis, unspecified forearm: Secondary | ICD-10-CM | POA: Diagnosis not present

## 2013-10-21 DIAGNOSIS — I129 Hypertensive chronic kidney disease with stage 1 through stage 4 chronic kidney disease, or unspecified chronic kidney disease: Secondary | ICD-10-CM | POA: Diagnosis not present

## 2013-10-21 DIAGNOSIS — M12139 Kaschin-Beck disease, unspecified wrist: Secondary | ICD-10-CM | POA: Diagnosis not present

## 2013-10-21 HISTORY — PX: CARPOMETACARPEL SUSPENSION PLASTY: SHX5005

## 2013-10-21 HISTORY — PX: CARPAL TUNNEL RELEASE: SHX101

## 2013-10-21 HISTORY — PX: TRIGGER FINGER RELEASE: SHX641

## 2013-10-21 LAB — POCT I-STAT, CHEM 8
BUN: 11 mg/dL (ref 6–23)
Calcium, Ion: 1.15 mmol/L (ref 1.13–1.30)
Chloride: 103 mEq/L (ref 96–112)
Creatinine, Ser: 1 mg/dL (ref 0.50–1.10)
Glucose, Bld: 114 mg/dL — ABNORMAL HIGH (ref 70–99)
HCT: 42 % (ref 36.0–46.0)
Hemoglobin: 14.3 g/dL (ref 12.0–15.0)
Potassium: 3.8 mEq/L (ref 3.7–5.3)
Sodium: 140 mEq/L (ref 137–147)
TCO2: 26 mmol/L (ref 0–100)

## 2013-10-21 SURGERY — RELEASE, A1 PULLEY, FOR TRIGGER FINGER
Anesthesia: General | Site: Wrist | Laterality: Left

## 2013-10-21 MED ORDER — LIDOCAINE HCL 2 % IJ SOLN
INTRAMUSCULAR | Status: AC
  Filled 2013-10-21: qty 20

## 2013-10-21 MED ORDER — ONDANSETRON HCL 4 MG/2ML IJ SOLN
INTRAMUSCULAR | Status: DC | PRN
  Administered 2013-10-21: 4 mg via INTRAVENOUS

## 2013-10-21 MED ORDER — PROPOFOL 10 MG/ML IV BOLUS
INTRAVENOUS | Status: DC | PRN
  Administered 2013-10-21: 150 mg via INTRAVENOUS

## 2013-10-21 MED ORDER — FENTANYL CITRATE 0.05 MG/ML IJ SOLN
INTRAMUSCULAR | Status: AC
  Filled 2013-10-21: qty 4

## 2013-10-21 MED ORDER — EPHEDRINE SULFATE 50 MG/ML IJ SOLN
INTRAMUSCULAR | Status: DC | PRN
  Administered 2013-10-21: 10 mg via INTRAVENOUS

## 2013-10-21 MED ORDER — LIDOCAINE HCL 2 % IJ SOLN
INTRAMUSCULAR | Status: DC | PRN
  Administered 2013-10-21: 9 mL

## 2013-10-21 MED ORDER — LIDOCAINE HCL (CARDIAC) 20 MG/ML IV SOLN
INTRAVENOUS | Status: DC | PRN
  Administered 2013-10-21: 50 mg via INTRAVENOUS

## 2013-10-21 MED ORDER — CHLORHEXIDINE GLUCONATE 4 % EX LIQD: 60.0000 mL | Freq: Once | CUTANEOUS | Status: DC

## 2013-10-21 MED ORDER — LACTATED RINGERS IV SOLN
INTRAVENOUS | Status: DC
  Administered 2013-10-21 (×2): via INTRAVENOUS

## 2013-10-21 MED ORDER — FENTANYL CITRATE 0.05 MG/ML IJ SOLN
INTRAMUSCULAR | Status: DC | PRN
  Administered 2013-10-21: 25 ug via INTRAVENOUS
  Administered 2013-10-21: 50 ug via INTRAVENOUS
  Administered 2013-10-21 (×3): 25 ug via INTRAVENOUS
  Administered 2013-10-21: 50 ug via INTRAVENOUS

## 2013-10-21 MED ORDER — MIDAZOLAM HCL 2 MG/2ML IJ SOLN
INTRAMUSCULAR | Status: AC
  Filled 2013-10-21: qty 2

## 2013-10-21 MED ORDER — HYDROMORPHONE HCL PF 1 MG/ML IJ SOLN
INTRAMUSCULAR | Status: AC
Start: 2013-10-21 — End: 2013-10-21
  Filled 2013-10-21: qty 1

## 2013-10-21 MED ORDER — MIDAZOLAM HCL 5 MG/5ML IJ SOLN
INTRAMUSCULAR | Status: DC | PRN
  Administered 2013-10-21: 2 mg via INTRAVENOUS

## 2013-10-21 MED ORDER — DEXAMETHASONE SODIUM PHOSPHATE 4 MG/ML IJ SOLN
INTRAMUSCULAR | Status: DC | PRN
  Administered 2013-10-21: 10 mg via INTRAVENOUS

## 2013-10-21 MED ORDER — CEFAZOLIN SODIUM-DEXTROSE 2-3 GM-% IV SOLR
2.0000 g | INTRAVENOUS | Status: AC
  Administered 2013-10-21: 2 g via INTRAVENOUS

## 2013-10-21 MED ORDER — HYDROMORPHONE HCL 2 MG PO TABS: 2.0000 mg | ORAL_TABLET | ORAL | Status: DC | PRN

## 2013-10-21 MED ORDER — FENTANYL CITRATE 0.05 MG/ML IJ SOLN: 50.0000 ug | INTRAMUSCULAR | Status: DC | PRN

## 2013-10-21 MED ORDER — MIDAZOLAM HCL 2 MG/2ML IJ SOLN: 1.0000 mg | INTRAMUSCULAR | Status: DC | PRN

## 2013-10-21 MED ORDER — CEPHALEXIN 500 MG PO CAPS: 500.0000 mg | ORAL_CAPSULE | Freq: Three times a day (TID) | ORAL | Status: DC

## 2013-10-21 MED ORDER — HYDROMORPHONE HCL PF 1 MG/ML IJ SOLN
0.2500 mg | INTRAMUSCULAR | Status: DC | PRN
  Administered 2013-10-21 (×2): 0.5 mg via INTRAVENOUS

## 2013-10-21 SURGICAL SUPPLY — 61 items
BAG DECANTER FOR FLEXI CONT (MISCELLANEOUS) IMPLANT
BANDAGE ADH SHEER 1  50/CT (GAUZE/BANDAGES/DRESSINGS) IMPLANT
BANDAGE COBAN STERILE 2 (GAUZE/BANDAGES/DRESSINGS) ×3 IMPLANT
BANDAGE ELASTIC 3 VELCRO ST LF (GAUZE/BANDAGES/DRESSINGS) ×6 IMPLANT
BLADE MINI RND TIP GREEN BEAV (BLADE) ×3 IMPLANT
BLADE SURG 15 STRL LF DISP TIS (BLADE) ×2 IMPLANT
BLADE SURG 15 STRL SS (BLADE) ×3
BNDG CMPR 9X4 STRL LF SNTH (GAUZE/BANDAGES/DRESSINGS) ×2
BNDG CMPR MD 5X2 ELC HKLP STRL (GAUZE/BANDAGES/DRESSINGS)
BNDG COHESIVE 3X5 TAN STRL LF (GAUZE/BANDAGES/DRESSINGS) IMPLANT
BNDG ELASTIC 2 VLCR STRL LF (GAUZE/BANDAGES/DRESSINGS) IMPLANT
BNDG ESMARK 4X9 LF (GAUZE/BANDAGES/DRESSINGS) ×3 IMPLANT
BNDG GAUZE ELAST 4 BULKY (GAUZE/BANDAGES/DRESSINGS) ×5 IMPLANT
BRUSH SCRUB EZ PLAIN DRY (MISCELLANEOUS) ×3 IMPLANT
CORDS BIPOLAR (ELECTRODE) ×3 IMPLANT
COVER MAYO STAND STRL (DRAPES) ×3 IMPLANT
COVER TABLE BACK 60X90 (DRAPES) ×3 IMPLANT
CUFF TOURNIQUET SINGLE 18IN (TOURNIQUET CUFF) ×1 IMPLANT
DECANTER SPIKE VIAL GLASS SM (MISCELLANEOUS) ×1 IMPLANT
DRAPE EXTREMITY T 121X128X90 (DRAPE) ×3 IMPLANT
DRAPE SURG 17X23 STRL (DRAPES) ×3 IMPLANT
GLOVE BIO SURGEON STRL SZ 6.5 (GLOVE) ×1 IMPLANT
GLOVE BIOGEL M STRL SZ7.5 (GLOVE) ×3 IMPLANT
GLOVE BIOGEL PI IND STRL 7.0 (GLOVE) IMPLANT
GLOVE BIOGEL PI INDICATOR 7.0 (GLOVE) ×2
GLOVE ECLIPSE 6.5 STRL STRAW (GLOVE) ×1 IMPLANT
GLOVE ORTHO TXT STRL SZ7.5 (GLOVE) ×3 IMPLANT
GOWN STRL REUS W/ TWL LRG LVL3 (GOWN DISPOSABLE) ×2 IMPLANT
GOWN STRL REUS W/ TWL XL LVL3 (GOWN DISPOSABLE) ×4 IMPLANT
GOWN STRL REUS W/TWL LRG LVL3 (GOWN DISPOSABLE) ×6
GOWN STRL REUS W/TWL XL LVL3 (GOWN DISPOSABLE) ×6
NEEDLE 27GAX1X1/2 (NEEDLE) ×1 IMPLANT
NS IRRIG 1000ML POUR BTL (IV SOLUTION) ×3 IMPLANT
PACK BASIN DAY SURGERY FS (CUSTOM PROCEDURE TRAY) ×3 IMPLANT
PAD CAST 3X4 CTTN HI CHSV (CAST SUPPLIES) IMPLANT
PADDING CAST ABS 4INX4YD NS (CAST SUPPLIES)
PADDING CAST ABS COTTON 4X4 ST (CAST SUPPLIES) ×2 IMPLANT
PADDING CAST COTTON 3X4 STRL (CAST SUPPLIES) ×3
PADDING UNDERCAST 2  STERILE (CAST SUPPLIES) ×1 IMPLANT
PASSER SUT SWANSON 36MM LOOP (INSTRUMENTS) ×3 IMPLANT
SLEEVE SCD COMPRESS KNEE MED (MISCELLANEOUS) ×1 IMPLANT
SPLINT PLASTER CAST XFAST 3X15 (CAST SUPPLIES) IMPLANT
SPLINT PLASTER XTRA FASTSET 3X (CAST SUPPLIES) ×12
SPONGE GAUZE 4X4 12PLY (GAUZE/BANDAGES/DRESSINGS) ×3 IMPLANT
STOCKINETTE 4X48 STRL (DRAPES) ×3 IMPLANT
STRIP CLOSURE SKIN 1/2X4 (GAUZE/BANDAGES/DRESSINGS) ×3 IMPLANT
SUT ETHIBOND 3-0 V-5 (SUTURE) ×1 IMPLANT
SUT ETHILON 5 0 P 3 18 (SUTURE)
SUT NYLON ETHILON 5-0 P-3 1X18 (SUTURE) IMPLANT
SUT PROLENE 3 0 PS 2 (SUTURE) ×3 IMPLANT
SUT PROLENE 4 0 P 3 18 (SUTURE) ×1 IMPLANT
SUT VIC AB 3-0 FS2 27 (SUTURE) IMPLANT
SUT VIC AB 4-0 P-3 18XBRD (SUTURE) ×2 IMPLANT
SUT VIC AB 4-0 P3 18 (SUTURE) ×3
SYR 3ML 23GX1 SAFETY (SYRINGE) IMPLANT
SYR BULB 3OZ (MISCELLANEOUS) ×3 IMPLANT
SYR CONTROL 10ML LL (SYRINGE) ×1 IMPLANT
TOWEL OR 17X24 6PK STRL BLUE (TOWEL DISPOSABLE) ×3 IMPLANT
TOWEL OR NON WOVEN STRL DISP B (DISPOSABLE) ×1 IMPLANT
TRAY DSU PREP LF (CUSTOM PROCEDURE TRAY) ×3 IMPLANT
UNDERPAD 30X30 INCONTINENT (UNDERPADS AND DIAPERS) ×3 IMPLANT

## 2013-10-21 NOTE — Anesthesia Procedure Notes (Signed)
Procedure Name: LMA Insertion Date/Time: 10/21/2013 10:07 AM Performed by: Mechele Claude Pre-anesthesia Checklist: Patient identified, Emergency Drugs available, Suction available and Patient being monitored Patient Re-evaluated:Patient Re-evaluated prior to inductionOxygen Delivery Method: Circle System Utilized Preoxygenation: Pre-oxygenation with 100% oxygen Intubation Type: IV induction Ventilation: Mask ventilation without difficulty LMA: LMA inserted LMA Size: 4.0 Number of attempts: 1 Airway Equipment and Method: bite block Placement Confirmation: positive ETCO2 Tube secured with: Tape Dental Injury: Teeth and Oropharynx as per pre-operative assessment

## 2013-10-21 NOTE — Anesthesia Preprocedure Evaluation (Signed)
Anesthesia Evaluation  Patient identified by MRN, date of birth, ID band Patient awake    Reviewed: Allergy & Precautions, H&P , NPO status , Patient's Chart, lab work & pertinent test results  Airway Mallampati: II TM Distance: >3 FB Neck ROM: Full    Dental no notable dental hx. (+) Teeth Intact, Dental Advisory Given   Pulmonary neg pulmonary ROS,  breath sounds clear to auscultation  Pulmonary exam normal       Cardiovascular hypertension, On Medications Rhythm:Regular Rate:Normal     Neuro/Psych  Headaches, PSYCHIATRIC DISORDERS Anxiety Depression negative psych ROS   GI/Hepatic Neg liver ROS, GERD-  Medicated and Controlled,  Endo/Other  negative endocrine ROS  Renal/GU Renal disease  negative genitourinary   Musculoskeletal   Abdominal   Peds  Hematology negative hematology ROS (+)   Anesthesia Other Findings   Reproductive/Obstetrics negative OB ROS                           Anesthesia Physical Anesthesia Plan  ASA: II  Anesthesia Plan: General   Post-op Pain Management:    Induction: Intravenous  Airway Management Planned: LMA  Additional Equipment:   Intra-op Plan:   Post-operative Plan: Extubation in OR  Informed Consent: I have reviewed the patients History and Physical, chart, labs and discussed the procedure including the risks, benefits and alternatives for the proposed anesthesia with the patient or authorized representative who has indicated his/her understanding and acceptance.   Dental advisory given  Plan Discussed with: CRNA  Anesthesia Plan Comments:         Anesthesia Quick Evaluation

## 2013-10-21 NOTE — Op Note (Signed)
Penny Morgan, Penny Morgan             ACCOUNT NO.:  [id number]  MEDICAL RECORD NO.:  [id number]  LOCATION:                                 FACILITY:  PHYSICIAN:  Youlanda Mighty. Kwame Ryland, M.D.      DATE OF BIRTH:  DATE OF PROCEDURE:  10/21/2013 DATE OF DISCHARGE:                              OPERATIVE REPORT   PREOPERATIVE DIAGNOSES: 1. End-stage painful left thumb carpometacarpal arthritis. 2. Left ring finger stenosing tenosynovitis. 3. Left thumb stenosing tenosynovitis. 4. Carpal tunnel syndrome with positive electrodiagnostic studies,     2010.  POSTOPERATIVE DIAGNOSES: 1. End-stage painful left thumb carpometacarpal arthritis. 2. Left ring finger stenosing tenosynovitis. 3. Left thumb stenosing tenosynovitis. 4. Carpal tunnel syndrome with positive electrodiagnostic studies,     2010. 5. Identification of avulsed hyaline cartilage from base of the thumb     carpometacarpal joint. 6.  Significant synovitis of left ring finger     at A1 pulley.  OPERATION: 1. Resection of left trapezium followed by knotted tendon     interposition arthroplasty in the manner of Garcia-Elias. 2. Transfer of abductor pollicis longus palmar slip to thenar     musculature. 3. Release of left ring finger A1 pulley. 4. Release of left thumb A1 pulley. 5. Release of left transverse carpal ligament.  OPERATING SURGEON:  Youlanda Mighty. Suhaas Agena, MD.  ASSISTANT:  Marily Lente Dasnoit, PA-C  ANESTHESIA:  General by LMA supplemented by 2% lidocaine block of all wounds for postoperative comfort.  SUPERVISING ANESTHESIOLOGIST:  Dr. Ola Spurr.  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  She is status post prior right carpal tunnel release.  We have followed Hassan Rowan for number of years for painful left thumb CMC arthritis and recently noted triggering of her left ring finger.  She made arrangements to proceed with reconstruction of her left  thumb CMC joint at this time anticipating release of her ring finger, A1 pulley, as well.  In a preoperative consult in the office, we discussed the fact that she had left carpal tunnel syndrome documented in 2010, and I advised her to let me know if she wanted to proceed with release of the transverse carpal ligament at the time of the surgery.  In the preoperative holding area, we had a second detailed informed consent during which she noted new triggering of her thumb, persistent triggering of her ring finger, pain at her [facility] joint of the thumb, and intermittent numbness in her hands.  Given the fact that she had positive electrodiagnostic studies in 2008 and she had a history of consistent numbness, we recommended proceeding with release of her transverse carpal ligament as well as release of her thumb A1 pulley at this time.  Questions regarding the anticipated procedures were invited and answered in detail.  Her proper surgical sites were marked per protocol with a marking pen in the holding area.  PROCEDURE:  Geneva Barrero was interviewed in the holding area by Dr. Ola Spurr of Anesthesia and after detailed informed consent, selected general anesthesia by LMA technique.  She was transferred to room 2, of the [facility] where under Dr. Blane Ohara direct supervision, general  anesthesia by LMA technique was induced followed by routine Betadine scrub and paint of the left arm and hand.  Sterile stockinette and impervious arthroscopy drapes applied.  A pneumatic tourniquet was applied to the proximal left brachium.  A 2 g of Ancef administered as an IV prophylactic antibiotic.  The left hand and arm were exsanguinated with an Esmarch bandage, the arterial tourniquet inflated to 220 mmHg.  Following routine surgical time-out, procedure commenced with transverse incision over the A1 pulleys of the thumb in the palm.  Subcutaneous tissues were carefully divided  taking care to gently retract the radial proper digital nerve of the thumb.  The A1 pulley was isolated, split with scalpel and scissors.  Thereafter, free range of motion of thumb IP joint was recovered.  The wound was repaired with intradermal 4-0 Prolene and Steri-Strips.  Attention was then directed to the ring finger flexor sheath.  A palpably thickened A1 pulley was noted.  An oblique incision was fashioned directly over the pulley followed by retraction of neurovascular structures.  The A1 pulley was released. The palmar fascia was released proximally and a small A0 pulley released. Thereafter, free range of motion of the ring finger was recovered.  This wound was likewise repaired with intradermal 4-0 Prolene and Steri- Strips.  Attention was then directed to the base of the thumb.  A Wagner curvilinear incision was fashioned with a Brunner zigzag extension proximally.  The radial superficial sensory branches were retracted. The first dorsal compartment was identified and split longitudinally. The abductor pollicis longus tendon slips were inventory and found to be robust.  The palmar slip of the abductor pollicis longus inserted on the trapezium and thenar fascia.  The more dorsal slip inserted the base of the thumb metacarpal.  We elected to use a dorsal slip as the interposition tendon.  We then performed a subperiosteal dissection of the trapezium noting loss of the hyaline articular cartilage on the distal surface of the trapezium and avulsion of the hyaline cartilage from the base of the thumb metacarpal.  After complete trapeziectomy was accomplished, synovectomy was accomplished and all loose bodies removed.  A distally based 50% slip of the flexor carpi radialis was harvested at the musculotendinous junction and brought distally with a Carroll tendon passing forceps.  A Swanson suture passer was used to pass the tendon through the fascia at the volar wrist flexion  crease.  We split the FCR to its insertion at the base of the index metacarpal and subsequently performed a 3 Windsor knot arthroplasty joining the abductor pollicis longus dorsal slip and the flexor carpi radialis in the manner of Garcia-Elias.  We then performed a pants-over-vest repair of the [facility] joint capsule with multiple mattress sutures of 3-0 Ethibond.  A very satisfactory interposition arthroplasty was created.  We then transferred the palmar slip of the abductor pollicis longus to the thenar musculature, taking its insertion distally approximately 15 mm.  Multiple grasping sutures were used to insert the tendon at this location.  The wounds were then irrigated and repaired with intradermal 4-0 Prolene.  A short incision was then fashioned in the palm.  The palmar fascia was split longitudinally followed by identification of the distal margin of the transverse carpal ligament.  The transverse carpal ligament was released along its ulnar border extending into the distal forearm, widely opening the carpal canal.  Bleeding points electrocauterized bipolar current followed by repair of the skin with intradermal 3-0 Prolene suture.  Compressive dressing was applied with  volar plaster splint maintaining the wrist at approximately 15 degrees of dorsiflexion of thumb and palmar abducted position protecting the tendon transfer. There were no apparent complications.      Youlanda Mighty Tabria Steines, M.D.     RVS/MEDQ  D:  10/21/2013  T:  10/21/2013  Job:  [id number]

## 2013-10-21 NOTE — Brief Op Note (Signed)
10/21/2013  11:33 AM  PATIENT:  Luisa Dago  68 y.o. female  PRE-OPERATIVE DIAGNOSIS:  LEFT THUMB CARPOMETACARPAL OSTEOARTHRITIS, STENOSING TENOSYNOVITIS LEFT THUMB AND RING FINGER  POST-OPERATIVE DIAGNOSIS:  LEFT THUMB CARPOMETACARPAL OSTEOARTHRITIS, STENOSING TENOSYNOVITIS LEFT THUMB AND RING FINGER, CARPALTUNNEL SYNDROME  PROCEDURE:  Procedure(s): LEFT RING  STENOSING TENOSYNOVITIS RELEASE AND  LEFT THUMB (Left) TRAPEZIECTOMY FLEXOR CARPI RADIALIS KNOT PLASTY (Left) CARPAL TUNNEL RELEASE (Left)  SURGEON:  Surgeon(s) and Role:    * Cammie Sickle., MD - Primary  PHYSICIAN ASSISTANT:   ASSISTANTS: Kathyrn Sheriff.A-C   ANESTHESIA:   general  EBL:  Total I/O In: 1000 [I.V.:1000] Out: -   BLOOD ADMINISTERED:none  DRAINS: none   LOCAL MEDICATIONS USED:  XYLOCAINE   SPECIMEN:  No Specimen  DISPOSITION OF SPECIMEN:  N/A  COUNTS:  YES  TOURNIQUET:  * Missing tourniquet times found for documented tourniquets in log:  [id number] *  DICTATION: .Other Dictation: Dictation Number [id number]  PLAN OF CARE: Discharge to home after PACU  PATIENT DISPOSITION:  PACU - hemodynamically stable.   Delay start of Pharmacological VTE agent (>24hrs) due to surgical blood loss or risk of bleeding: not applicable

## 2013-10-21 NOTE — Discharge Instructions (Addendum)

## 2013-10-21 NOTE — Transfer of Care (Signed)
Immediate Anesthesia Transfer of Care Note  Patient: Penny Morgan  Procedure(s) Performed: Procedure(s) (LRB): LEFT RING  STENOSING TENOSYNOVITIS RELEASE AND  LEFT THUMB (Left) TRAPEZIECTOMY FLEXOR CARPI RADIALIS KNOT PLASTY (Left) CARPAL TUNNEL RELEASE (Left)  Patient Location: PACU  Anesthesia Type: General  Level of Consciousness: awake, alert  and oriented  Airway & Oxygen Therapy: Patient Spontanous Breathing and Patient connected to face mask oxygen, oral airway in place.  Post-op Assessment: Report given to PACU RN and Post -op Vital signs reviewed and stable  Post vital signs: Reviewed and stable  Complications: No apparent anesthesia complications

## 2013-10-21 NOTE — Anesthesia Postprocedure Evaluation (Signed)
  Anesthesia Post-op Note  Patient: Penny Morgan  Procedure(s) Performed: Procedure(s): LEFT RING  STENOSING TENOSYNOVITIS RELEASE AND  LEFT THUMB (Left) TRAPEZIECTOMY FLEXOR CARPI RADIALIS KNOT PLASTY (Left) CARPAL TUNNEL RELEASE (Left)  Patient Location: PACU  Anesthesia Type:General  Level of Consciousness: awake and alert   Airway and Oxygen Therapy: Patient Spontanous Breathing  Post-op Pain: mild  Post-op Assessment: Post-op Vital signs reviewed, Patient's Cardiovascular Status Stable and Respiratory Function Stable  Post-op Vital Signs: Reviewed  Filed Vitals:   10/21/13 1245  BP: 126/72  Pulse: 107  Temp:   Resp: 12    Complications: No apparent anesthesia complications

## 2013-10-22 ENCOUNTER — Encounter (HOSPITAL_BASED_OUTPATIENT_CLINIC_OR_DEPARTMENT_OTHER): Payer: Self-pay | Admitting: Orthopedic Surgery

## 2013-10-29 DIAGNOSIS — M6530 Trigger finger, unspecified finger: Secondary | ICD-10-CM | POA: Diagnosis not present

## 2013-10-29 DIAGNOSIS — M19039 Primary osteoarthritis, unspecified wrist: Secondary | ICD-10-CM | POA: Diagnosis not present

## 2013-10-29 DIAGNOSIS — E039 Hypothyroidism, unspecified: Secondary | ICD-10-CM | POA: Diagnosis not present

## 2013-11-24 DIAGNOSIS — M19049 Primary osteoarthritis, unspecified hand: Secondary | ICD-10-CM | POA: Diagnosis not present

## 2013-12-01 DIAGNOSIS — M19049 Primary osteoarthritis, unspecified hand: Secondary | ICD-10-CM | POA: Diagnosis not present

## 2013-12-04 DIAGNOSIS — I10 Essential (primary) hypertension: Secondary | ICD-10-CM | POA: Diagnosis not present

## 2013-12-04 DIAGNOSIS — R7309 Other abnormal glucose: Secondary | ICD-10-CM | POA: Diagnosis not present

## 2013-12-04 DIAGNOSIS — E039 Hypothyroidism, unspecified: Secondary | ICD-10-CM | POA: Diagnosis not present

## 2013-12-04 DIAGNOSIS — K219 Gastro-esophageal reflux disease without esophagitis: Secondary | ICD-10-CM | POA: Diagnosis not present

## 2013-12-04 DIAGNOSIS — M2550 Pain in unspecified joint: Secondary | ICD-10-CM | POA: Diagnosis not present

## 2013-12-04 DIAGNOSIS — E7800 Pure hypercholesterolemia, unspecified: Secondary | ICD-10-CM | POA: Diagnosis not present

## 2013-12-04 DIAGNOSIS — N189 Chronic kidney disease, unspecified: Secondary | ICD-10-CM | POA: Diagnosis not present

## 2013-12-09 DIAGNOSIS — M19049 Primary osteoarthritis, unspecified hand: Secondary | ICD-10-CM | POA: Diagnosis not present

## 2013-12-10 DIAGNOSIS — I10 Essential (primary) hypertension: Secondary | ICD-10-CM | POA: Diagnosis not present

## 2013-12-10 DIAGNOSIS — Z23 Encounter for immunization: Secondary | ICD-10-CM | POA: Diagnosis not present

## 2013-12-10 DIAGNOSIS — K219 Gastro-esophageal reflux disease without esophagitis: Secondary | ICD-10-CM | POA: Diagnosis not present

## 2013-12-10 DIAGNOSIS — N189 Chronic kidney disease, unspecified: Secondary | ICD-10-CM | POA: Diagnosis not present

## 2013-12-10 DIAGNOSIS — G4700 Insomnia, unspecified: Secondary | ICD-10-CM | POA: Diagnosis not present

## 2013-12-10 DIAGNOSIS — R7309 Other abnormal glucose: Secondary | ICD-10-CM | POA: Diagnosis not present

## 2013-12-10 DIAGNOSIS — E7800 Pure hypercholesterolemia, unspecified: Secondary | ICD-10-CM | POA: Diagnosis not present

## 2013-12-10 DIAGNOSIS — E039 Hypothyroidism, unspecified: Secondary | ICD-10-CM | POA: Diagnosis not present

## 2013-12-15 DIAGNOSIS — N950 Postmenopausal bleeding: Secondary | ICD-10-CM | POA: Diagnosis not present

## 2013-12-17 DIAGNOSIS — M19049 Primary osteoarthritis, unspecified hand: Secondary | ICD-10-CM | POA: Diagnosis not present

## 2014-01-05 DIAGNOSIS — D259 Leiomyoma of uterus, unspecified: Secondary | ICD-10-CM | POA: Diagnosis not present

## 2014-01-05 DIAGNOSIS — N83209 Unspecified ovarian cyst, unspecified side: Secondary | ICD-10-CM | POA: Diagnosis not present

## 2014-01-05 DIAGNOSIS — N950 Postmenopausal bleeding: Secondary | ICD-10-CM | POA: Diagnosis not present

## 2014-01-05 DIAGNOSIS — Z1231 Encounter for screening mammogram for malignant neoplasm of breast: Secondary | ICD-10-CM | POA: Diagnosis not present

## 2014-01-21 DIAGNOSIS — E039 Hypothyroidism, unspecified: Secondary | ICD-10-CM | POA: Diagnosis not present

## 2014-02-04 DIAGNOSIS — D2360 Other benign neoplasm of skin of unspecified upper limb, including shoulder: Secondary | ICD-10-CM | POA: Diagnosis not present

## 2014-02-04 DIAGNOSIS — Z85820 Personal history of malignant melanoma of skin: Secondary | ICD-10-CM | POA: Diagnosis not present

## 2014-02-04 DIAGNOSIS — D485 Neoplasm of uncertain behavior of skin: Secondary | ICD-10-CM | POA: Diagnosis not present

## 2014-02-04 DIAGNOSIS — D235 Other benign neoplasm of skin of trunk: Secondary | ICD-10-CM | POA: Diagnosis not present

## 2014-02-04 DIAGNOSIS — D239 Other benign neoplasm of skin, unspecified: Secondary | ICD-10-CM | POA: Diagnosis not present

## 2014-03-04 DIAGNOSIS — E039 Hypothyroidism, unspecified: Secondary | ICD-10-CM | POA: Diagnosis not present

## 2014-03-04 DIAGNOSIS — E7800 Pure hypercholesterolemia, unspecified: Secondary | ICD-10-CM | POA: Diagnosis not present

## 2014-03-04 DIAGNOSIS — I10 Essential (primary) hypertension: Secondary | ICD-10-CM | POA: Diagnosis not present

## 2014-03-04 DIAGNOSIS — R7309 Other abnormal glucose: Secondary | ICD-10-CM | POA: Diagnosis not present

## 2014-03-04 DIAGNOSIS — K219 Gastro-esophageal reflux disease without esophagitis: Secondary | ICD-10-CM | POA: Diagnosis not present

## 2014-03-04 DIAGNOSIS — N189 Chronic kidney disease, unspecified: Secondary | ICD-10-CM | POA: Diagnosis not present

## 2014-03-11 DIAGNOSIS — E039 Hypothyroidism, unspecified: Secondary | ICD-10-CM | POA: Diagnosis not present

## 2014-03-11 DIAGNOSIS — M2550 Pain in unspecified joint: Secondary | ICD-10-CM | POA: Diagnosis not present

## 2014-03-11 DIAGNOSIS — R7309 Other abnormal glucose: Secondary | ICD-10-CM | POA: Diagnosis not present

## 2014-03-11 DIAGNOSIS — N189 Chronic kidney disease, unspecified: Secondary | ICD-10-CM | POA: Diagnosis not present

## 2014-03-11 DIAGNOSIS — K219 Gastro-esophageal reflux disease without esophagitis: Secondary | ICD-10-CM | POA: Diagnosis not present

## 2014-03-11 DIAGNOSIS — G4700 Insomnia, unspecified: Secondary | ICD-10-CM | POA: Diagnosis not present

## 2014-03-11 DIAGNOSIS — I10 Essential (primary) hypertension: Secondary | ICD-10-CM | POA: Diagnosis not present

## 2014-03-11 DIAGNOSIS — E7800 Pure hypercholesterolemia, unspecified: Secondary | ICD-10-CM | POA: Diagnosis not present

## 2014-03-11 DIAGNOSIS — R300 Dysuria: Secondary | ICD-10-CM | POA: Diagnosis not present

## 2014-04-22 DIAGNOSIS — E039 Hypothyroidism, unspecified: Secondary | ICD-10-CM | POA: Diagnosis not present

## 2014-04-22 DIAGNOSIS — F411 Generalized anxiety disorder: Secondary | ICD-10-CM | POA: Diagnosis not present

## 2014-04-22 DIAGNOSIS — Z23 Encounter for immunization: Secondary | ICD-10-CM | POA: Diagnosis not present

## 2014-05-26 DIAGNOSIS — H2513 Age-related nuclear cataract, bilateral: Secondary | ICD-10-CM | POA: Diagnosis not present

## 2014-07-03 DIAGNOSIS — D229 Melanocytic nevi, unspecified: Secondary | ICD-10-CM | POA: Diagnosis not present

## 2014-07-03 DIAGNOSIS — D485 Neoplasm of uncertain behavior of skin: Secondary | ICD-10-CM | POA: Diagnosis not present

## 2014-07-03 DIAGNOSIS — D2272 Melanocytic nevi of left lower limb, including hip: Secondary | ICD-10-CM | POA: Diagnosis not present

## 2014-07-03 DIAGNOSIS — D239 Other benign neoplasm of skin, unspecified: Secondary | ICD-10-CM | POA: Diagnosis not present

## 2014-07-03 DIAGNOSIS — Z85820 Personal history of malignant melanoma of skin: Secondary | ICD-10-CM | POA: Diagnosis not present

## 2014-07-08 DIAGNOSIS — I10 Essential (primary) hypertension: Secondary | ICD-10-CM | POA: Diagnosis not present

## 2014-07-08 DIAGNOSIS — M1811 Unilateral primary osteoarthritis of first carpometacarpal joint, right hand: Secondary | ICD-10-CM | POA: Diagnosis not present

## 2014-07-08 DIAGNOSIS — R739 Hyperglycemia, unspecified: Secondary | ICD-10-CM | POA: Diagnosis not present

## 2014-07-08 DIAGNOSIS — E780 Pure hypercholesterolemia: Secondary | ICD-10-CM | POA: Diagnosis not present

## 2014-07-08 DIAGNOSIS — N189 Chronic kidney disease, unspecified: Secondary | ICD-10-CM | POA: Diagnosis not present

## 2014-07-08 DIAGNOSIS — E039 Hypothyroidism, unspecified: Secondary | ICD-10-CM | POA: Diagnosis not present

## 2014-07-21 DIAGNOSIS — Z1389 Encounter for screening for other disorder: Secondary | ICD-10-CM | POA: Diagnosis not present

## 2014-07-21 DIAGNOSIS — E039 Hypothyroidism, unspecified: Secondary | ICD-10-CM | POA: Diagnosis not present

## 2014-07-21 DIAGNOSIS — G4700 Insomnia, unspecified: Secondary | ICD-10-CM | POA: Diagnosis not present

## 2014-07-21 DIAGNOSIS — F419 Anxiety disorder, unspecified: Secondary | ICD-10-CM | POA: Diagnosis not present

## 2014-07-21 DIAGNOSIS — K219 Gastro-esophageal reflux disease without esophagitis: Secondary | ICD-10-CM | POA: Diagnosis not present

## 2014-07-21 DIAGNOSIS — E785 Hyperlipidemia, unspecified: Secondary | ICD-10-CM | POA: Diagnosis not present

## 2014-07-21 DIAGNOSIS — I10 Essential (primary) hypertension: Secondary | ICD-10-CM | POA: Diagnosis not present

## 2014-11-10 DIAGNOSIS — I10 Essential (primary) hypertension: Secondary | ICD-10-CM | POA: Diagnosis not present

## 2014-11-10 DIAGNOSIS — F419 Anxiety disorder, unspecified: Secondary | ICD-10-CM | POA: Diagnosis not present

## 2014-11-10 DIAGNOSIS — E780 Pure hypercholesterolemia: Secondary | ICD-10-CM | POA: Diagnosis not present

## 2014-11-10 DIAGNOSIS — E039 Hypothyroidism, unspecified: Secondary | ICD-10-CM | POA: Diagnosis not present

## 2014-11-10 DIAGNOSIS — E785 Hyperlipidemia, unspecified: Secondary | ICD-10-CM | POA: Diagnosis not present

## 2014-11-10 DIAGNOSIS — R5383 Other fatigue: Secondary | ICD-10-CM | POA: Diagnosis not present

## 2014-11-10 DIAGNOSIS — K219 Gastro-esophageal reflux disease without esophagitis: Secondary | ICD-10-CM | POA: Diagnosis not present

## 2014-11-10 DIAGNOSIS — D519 Vitamin B12 deficiency anemia, unspecified: Secondary | ICD-10-CM | POA: Diagnosis not present

## 2014-11-10 DIAGNOSIS — N189 Chronic kidney disease, unspecified: Secondary | ICD-10-CM | POA: Diagnosis not present

## 2014-11-10 DIAGNOSIS — R739 Hyperglycemia, unspecified: Secondary | ICD-10-CM | POA: Diagnosis not present

## 2014-11-17 DIAGNOSIS — R739 Hyperglycemia, unspecified: Secondary | ICD-10-CM | POA: Diagnosis not present

## 2014-11-17 DIAGNOSIS — E039 Hypothyroidism, unspecified: Secondary | ICD-10-CM | POA: Diagnosis not present

## 2014-11-17 DIAGNOSIS — F419 Anxiety disorder, unspecified: Secondary | ICD-10-CM | POA: Diagnosis not present

## 2014-11-17 DIAGNOSIS — E785 Hyperlipidemia, unspecified: Secondary | ICD-10-CM | POA: Diagnosis not present

## 2014-11-17 DIAGNOSIS — N189 Chronic kidney disease, unspecified: Secondary | ICD-10-CM | POA: Diagnosis not present

## 2014-11-17 DIAGNOSIS — E780 Pure hypercholesterolemia: Secondary | ICD-10-CM | POA: Diagnosis not present

## 2014-11-17 DIAGNOSIS — G4700 Insomnia, unspecified: Secondary | ICD-10-CM | POA: Diagnosis not present

## 2014-11-17 DIAGNOSIS — K219 Gastro-esophageal reflux disease without esophagitis: Secondary | ICD-10-CM | POA: Diagnosis not present

## 2014-11-17 DIAGNOSIS — I10 Essential (primary) hypertension: Secondary | ICD-10-CM | POA: Diagnosis not present

## 2014-12-29 DIAGNOSIS — E039 Hypothyroidism, unspecified: Secondary | ICD-10-CM | POA: Diagnosis not present

## 2015-01-21 DIAGNOSIS — Z85820 Personal history of malignant melanoma of skin: Secondary | ICD-10-CM | POA: Diagnosis not present

## 2015-01-21 DIAGNOSIS — D3612 Benign neoplasm of peripheral nerves and autonomic nervous system, upper limb, including shoulder: Secondary | ICD-10-CM | POA: Diagnosis not present

## 2015-01-21 DIAGNOSIS — D239 Other benign neoplasm of skin, unspecified: Secondary | ICD-10-CM | POA: Diagnosis not present

## 2015-01-21 DIAGNOSIS — D225 Melanocytic nevi of trunk: Secondary | ICD-10-CM | POA: Diagnosis not present

## 2015-01-21 DIAGNOSIS — D485 Neoplasm of uncertain behavior of skin: Secondary | ICD-10-CM | POA: Diagnosis not present

## 2015-01-22 DIAGNOSIS — Z01419 Encounter for gynecological examination (general) (routine) without abnormal findings: Secondary | ICD-10-CM | POA: Diagnosis not present

## 2015-01-22 DIAGNOSIS — Z6834 Body mass index (BMI) 34.0-34.9, adult: Secondary | ICD-10-CM | POA: Diagnosis not present

## 2015-01-22 DIAGNOSIS — Z1231 Encounter for screening mammogram for malignant neoplasm of breast: Secondary | ICD-10-CM | POA: Diagnosis not present

## 2015-03-15 DIAGNOSIS — Z23 Encounter for immunization: Secondary | ICD-10-CM | POA: Diagnosis not present

## 2015-05-20 DIAGNOSIS — E7800 Pure hypercholesterolemia, unspecified: Secondary | ICD-10-CM | POA: Diagnosis not present

## 2015-05-20 DIAGNOSIS — I10 Essential (primary) hypertension: Secondary | ICD-10-CM | POA: Diagnosis not present

## 2015-05-20 DIAGNOSIS — R739 Hyperglycemia, unspecified: Secondary | ICD-10-CM | POA: Diagnosis not present

## 2015-05-20 DIAGNOSIS — N189 Chronic kidney disease, unspecified: Secondary | ICD-10-CM | POA: Diagnosis not present

## 2015-05-20 DIAGNOSIS — E039 Hypothyroidism, unspecified: Secondary | ICD-10-CM | POA: Diagnosis not present

## 2015-05-20 DIAGNOSIS — E785 Hyperlipidemia, unspecified: Secondary | ICD-10-CM | POA: Diagnosis not present

## 2015-05-25 DIAGNOSIS — R739 Hyperglycemia, unspecified: Secondary | ICD-10-CM | POA: Diagnosis not present

## 2015-05-25 DIAGNOSIS — E039 Hypothyroidism, unspecified: Secondary | ICD-10-CM | POA: Diagnosis not present

## 2015-05-25 DIAGNOSIS — F419 Anxiety disorder, unspecified: Secondary | ICD-10-CM | POA: Diagnosis not present

## 2015-05-25 DIAGNOSIS — K219 Gastro-esophageal reflux disease without esophagitis: Secondary | ICD-10-CM | POA: Diagnosis not present

## 2015-05-25 DIAGNOSIS — Z23 Encounter for immunization: Secondary | ICD-10-CM | POA: Diagnosis not present

## 2015-05-25 DIAGNOSIS — N189 Chronic kidney disease, unspecified: Secondary | ICD-10-CM | POA: Diagnosis not present

## 2015-05-25 DIAGNOSIS — I10 Essential (primary) hypertension: Secondary | ICD-10-CM | POA: Diagnosis not present

## 2015-05-25 DIAGNOSIS — G4700 Insomnia, unspecified: Secondary | ICD-10-CM | POA: Diagnosis not present

## 2015-05-25 DIAGNOSIS — Z13220 Encounter for screening for lipoid disorders: Secondary | ICD-10-CM | POA: Diagnosis not present

## 2015-05-25 DIAGNOSIS — E785 Hyperlipidemia, unspecified: Secondary | ICD-10-CM | POA: Diagnosis not present

## 2015-09-09 DIAGNOSIS — Z85820 Personal history of malignant melanoma of skin: Secondary | ICD-10-CM | POA: Diagnosis not present

## 2015-09-09 DIAGNOSIS — D239 Other benign neoplasm of skin, unspecified: Secondary | ICD-10-CM | POA: Diagnosis not present

## 2015-09-09 DIAGNOSIS — D485 Neoplasm of uncertain behavior of skin: Secondary | ICD-10-CM | POA: Diagnosis not present

## 2015-09-09 DIAGNOSIS — D235 Other benign neoplasm of skin of trunk: Secondary | ICD-10-CM | POA: Diagnosis not present

## 2015-09-14 DIAGNOSIS — E039 Hypothyroidism, unspecified: Secondary | ICD-10-CM | POA: Diagnosis not present

## 2015-09-14 DIAGNOSIS — K219 Gastro-esophageal reflux disease without esophagitis: Secondary | ICD-10-CM | POA: Diagnosis not present

## 2015-09-14 DIAGNOSIS — N189 Chronic kidney disease, unspecified: Secondary | ICD-10-CM | POA: Diagnosis not present

## 2015-09-14 DIAGNOSIS — R739 Hyperglycemia, unspecified: Secondary | ICD-10-CM | POA: Diagnosis not present

## 2015-09-14 DIAGNOSIS — E7800 Pure hypercholesterolemia, unspecified: Secondary | ICD-10-CM | POA: Diagnosis not present

## 2015-09-14 DIAGNOSIS — I10 Essential (primary) hypertension: Secondary | ICD-10-CM | POA: Diagnosis not present

## 2015-09-17 DIAGNOSIS — I10 Essential (primary) hypertension: Secondary | ICD-10-CM | POA: Diagnosis not present

## 2015-09-17 DIAGNOSIS — E785 Hyperlipidemia, unspecified: Secondary | ICD-10-CM | POA: Diagnosis not present

## 2015-09-17 DIAGNOSIS — E039 Hypothyroidism, unspecified: Secondary | ICD-10-CM | POA: Diagnosis not present

## 2015-09-17 DIAGNOSIS — G4700 Insomnia, unspecified: Secondary | ICD-10-CM | POA: Diagnosis not present

## 2015-09-17 DIAGNOSIS — Z13220 Encounter for screening for lipoid disorders: Secondary | ICD-10-CM | POA: Diagnosis not present

## 2015-09-17 DIAGNOSIS — F419 Anxiety disorder, unspecified: Secondary | ICD-10-CM | POA: Diagnosis not present

## 2015-09-17 DIAGNOSIS — K219 Gastro-esophageal reflux disease without esophagitis: Secondary | ICD-10-CM | POA: Diagnosis not present

## 2015-09-17 DIAGNOSIS — N189 Chronic kidney disease, unspecified: Secondary | ICD-10-CM | POA: Diagnosis not present

## 2015-09-17 DIAGNOSIS — R739 Hyperglycemia, unspecified: Secondary | ICD-10-CM | POA: Diagnosis not present

## 2015-10-29 DIAGNOSIS — L304 Erythema intertrigo: Secondary | ICD-10-CM | POA: Diagnosis not present

## 2016-01-10 DIAGNOSIS — Z13220 Encounter for screening for lipoid disorders: Secondary | ICD-10-CM | POA: Diagnosis not present

## 2016-01-10 DIAGNOSIS — F419 Anxiety disorder, unspecified: Secondary | ICD-10-CM | POA: Diagnosis not present

## 2016-01-10 DIAGNOSIS — I10 Essential (primary) hypertension: Secondary | ICD-10-CM | POA: Diagnosis not present

## 2016-01-10 DIAGNOSIS — K219 Gastro-esophageal reflux disease without esophagitis: Secondary | ICD-10-CM | POA: Diagnosis not present

## 2016-01-10 DIAGNOSIS — E039 Hypothyroidism, unspecified: Secondary | ICD-10-CM | POA: Diagnosis not present

## 2016-01-10 DIAGNOSIS — M2550 Pain in unspecified joint: Secondary | ICD-10-CM | POA: Diagnosis not present

## 2016-01-10 DIAGNOSIS — E7800 Pure hypercholesterolemia, unspecified: Secondary | ICD-10-CM | POA: Diagnosis not present

## 2016-01-10 DIAGNOSIS — E785 Hyperlipidemia, unspecified: Secondary | ICD-10-CM | POA: Diagnosis not present

## 2016-01-10 DIAGNOSIS — R739 Hyperglycemia, unspecified: Secondary | ICD-10-CM | POA: Diagnosis not present

## 2016-01-10 DIAGNOSIS — N189 Chronic kidney disease, unspecified: Secondary | ICD-10-CM | POA: Diagnosis not present

## 2016-01-12 DIAGNOSIS — K219 Gastro-esophageal reflux disease without esophagitis: Secondary | ICD-10-CM | POA: Diagnosis not present

## 2016-01-12 DIAGNOSIS — F419 Anxiety disorder, unspecified: Secondary | ICD-10-CM | POA: Diagnosis not present

## 2016-01-12 DIAGNOSIS — R079 Chest pain, unspecified: Secondary | ICD-10-CM | POA: Diagnosis not present

## 2016-01-12 DIAGNOSIS — N189 Chronic kidney disease, unspecified: Secondary | ICD-10-CM | POA: Diagnosis not present

## 2016-01-12 DIAGNOSIS — I10 Essential (primary) hypertension: Secondary | ICD-10-CM | POA: Diagnosis not present

## 2016-01-12 DIAGNOSIS — E785 Hyperlipidemia, unspecified: Secondary | ICD-10-CM | POA: Diagnosis not present

## 2016-01-12 DIAGNOSIS — E039 Hypothyroidism, unspecified: Secondary | ICD-10-CM | POA: Diagnosis not present

## 2016-01-27 DIAGNOSIS — R079 Chest pain, unspecified: Secondary | ICD-10-CM | POA: Diagnosis not present

## 2016-03-27 ENCOUNTER — Other Ambulatory Visit: Payer: Self-pay

## 2016-04-12 DIAGNOSIS — H2513 Age-related nuclear cataract, bilateral: Secondary | ICD-10-CM | POA: Diagnosis not present

## 2016-04-12 DIAGNOSIS — Z23 Encounter for immunization: Secondary | ICD-10-CM | POA: Diagnosis not present

## 2016-04-12 DIAGNOSIS — H2511 Age-related nuclear cataract, right eye: Secondary | ICD-10-CM | POA: Diagnosis not present

## 2016-05-04 DIAGNOSIS — E039 Hypothyroidism, unspecified: Secondary | ICD-10-CM | POA: Diagnosis not present

## 2016-05-04 DIAGNOSIS — E119 Type 2 diabetes mellitus without complications: Secondary | ICD-10-CM | POA: Diagnosis not present

## 2016-05-04 DIAGNOSIS — E782 Mixed hyperlipidemia: Secondary | ICD-10-CM | POA: Diagnosis not present

## 2016-05-09 DIAGNOSIS — E039 Hypothyroidism, unspecified: Secondary | ICD-10-CM | POA: Diagnosis not present

## 2016-05-09 DIAGNOSIS — E785 Hyperlipidemia, unspecified: Secondary | ICD-10-CM | POA: Diagnosis not present

## 2016-05-09 DIAGNOSIS — N189 Chronic kidney disease, unspecified: Secondary | ICD-10-CM | POA: Diagnosis not present

## 2016-05-09 DIAGNOSIS — F419 Anxiety disorder, unspecified: Secondary | ICD-10-CM | POA: Diagnosis not present

## 2016-05-09 DIAGNOSIS — Z1389 Encounter for screening for other disorder: Secondary | ICD-10-CM | POA: Diagnosis not present

## 2016-05-09 DIAGNOSIS — K219 Gastro-esophageal reflux disease without esophagitis: Secondary | ICD-10-CM | POA: Diagnosis not present

## 2016-05-09 DIAGNOSIS — I10 Essential (primary) hypertension: Secondary | ICD-10-CM | POA: Diagnosis not present

## 2016-05-09 DIAGNOSIS — R739 Hyperglycemia, unspecified: Secondary | ICD-10-CM | POA: Diagnosis not present

## 2016-05-15 DIAGNOSIS — H2511 Age-related nuclear cataract, right eye: Secondary | ICD-10-CM | POA: Diagnosis not present

## 2016-05-22 DIAGNOSIS — K5732 Diverticulitis of large intestine without perforation or abscess without bleeding: Secondary | ICD-10-CM | POA: Diagnosis not present

## 2016-05-22 DIAGNOSIS — K5730 Diverticulosis of large intestine without perforation or abscess without bleeding: Secondary | ICD-10-CM | POA: Diagnosis not present

## 2016-05-22 DIAGNOSIS — E039 Hypothyroidism, unspecified: Secondary | ICD-10-CM | POA: Diagnosis not present

## 2016-05-22 DIAGNOSIS — I10 Essential (primary) hypertension: Secondary | ICD-10-CM | POA: Diagnosis not present

## 2016-05-22 DIAGNOSIS — Z8659 Personal history of other mental and behavioral disorders: Secondary | ICD-10-CM | POA: Diagnosis not present

## 2016-06-01 DIAGNOSIS — Z1231 Encounter for screening mammogram for malignant neoplasm of breast: Secondary | ICD-10-CM | POA: Diagnosis not present

## 2016-06-01 DIAGNOSIS — Z6834 Body mass index (BMI) 34.0-34.9, adult: Secondary | ICD-10-CM | POA: Diagnosis not present

## 2016-06-01 DIAGNOSIS — Z01419 Encounter for gynecological examination (general) (routine) without abnormal findings: Secondary | ICD-10-CM | POA: Diagnosis not present

## 2016-06-06 DIAGNOSIS — M47816 Spondylosis without myelopathy or radiculopathy, lumbar region: Secondary | ICD-10-CM | POA: Diagnosis not present

## 2016-06-21 DIAGNOSIS — D2239 Melanocytic nevi of other parts of face: Secondary | ICD-10-CM | POA: Diagnosis not present

## 2016-06-21 DIAGNOSIS — D239 Other benign neoplasm of skin, unspecified: Secondary | ICD-10-CM | POA: Diagnosis not present

## 2016-06-21 DIAGNOSIS — Z85820 Personal history of malignant melanoma of skin: Secondary | ICD-10-CM | POA: Diagnosis not present

## 2016-06-21 DIAGNOSIS — D492 Neoplasm of unspecified behavior of bone, soft tissue, and skin: Secondary | ICD-10-CM | POA: Diagnosis not present

## 2016-06-21 DIAGNOSIS — L814 Other melanin hyperpigmentation: Secondary | ICD-10-CM | POA: Diagnosis not present

## 2016-07-07 DIAGNOSIS — H2512 Age-related nuclear cataract, left eye: Secondary | ICD-10-CM | POA: Diagnosis not present

## 2016-07-31 HISTORY — PX: EYE SURGERY: SHX253

## 2016-09-06 DIAGNOSIS — I10 Essential (primary) hypertension: Secondary | ICD-10-CM | POA: Diagnosis not present

## 2016-09-06 DIAGNOSIS — E119 Type 2 diabetes mellitus without complications: Secondary | ICD-10-CM | POA: Diagnosis not present

## 2016-09-06 DIAGNOSIS — K219 Gastro-esophageal reflux disease without esophagitis: Secondary | ICD-10-CM | POA: Diagnosis not present

## 2016-09-06 DIAGNOSIS — E039 Hypothyroidism, unspecified: Secondary | ICD-10-CM | POA: Diagnosis not present

## 2016-09-06 DIAGNOSIS — E782 Mixed hyperlipidemia: Secondary | ICD-10-CM | POA: Diagnosis not present

## 2016-09-06 DIAGNOSIS — N189 Chronic kidney disease, unspecified: Secondary | ICD-10-CM | POA: Diagnosis not present

## 2016-09-08 DIAGNOSIS — F419 Anxiety disorder, unspecified: Secondary | ICD-10-CM | POA: Diagnosis not present

## 2016-09-08 DIAGNOSIS — R739 Hyperglycemia, unspecified: Secondary | ICD-10-CM | POA: Diagnosis not present

## 2016-09-08 DIAGNOSIS — K219 Gastro-esophageal reflux disease without esophagitis: Secondary | ICD-10-CM | POA: Diagnosis not present

## 2016-09-08 DIAGNOSIS — E785 Hyperlipidemia, unspecified: Secondary | ICD-10-CM | POA: Diagnosis not present

## 2016-09-08 DIAGNOSIS — I10 Essential (primary) hypertension: Secondary | ICD-10-CM | POA: Diagnosis not present

## 2016-09-08 DIAGNOSIS — Z1212 Encounter for screening for malignant neoplasm of rectum: Secondary | ICD-10-CM | POA: Diagnosis not present

## 2016-09-08 DIAGNOSIS — E039 Hypothyroidism, unspecified: Secondary | ICD-10-CM | POA: Diagnosis not present

## 2016-09-08 DIAGNOSIS — N189 Chronic kidney disease, unspecified: Secondary | ICD-10-CM | POA: Diagnosis not present

## 2016-09-13 DIAGNOSIS — Z6831 Body mass index (BMI) 31.0-31.9, adult: Secondary | ICD-10-CM | POA: Diagnosis not present

## 2016-09-13 DIAGNOSIS — Z23 Encounter for immunization: Secondary | ICD-10-CM | POA: Diagnosis not present

## 2016-09-13 DIAGNOSIS — R600 Localized edema: Secondary | ICD-10-CM | POA: Diagnosis not present

## 2016-09-13 DIAGNOSIS — W5501XA Bitten by cat, initial encounter: Secondary | ICD-10-CM | POA: Diagnosis not present

## 2016-09-13 DIAGNOSIS — M79645 Pain in left finger(s): Secondary | ICD-10-CM | POA: Diagnosis not present

## 2016-09-20 DIAGNOSIS — W5501XD Bitten by cat, subsequent encounter: Secondary | ICD-10-CM | POA: Diagnosis not present

## 2016-09-20 DIAGNOSIS — Z6831 Body mass index (BMI) 31.0-31.9, adult: Secondary | ICD-10-CM | POA: Diagnosis not present

## 2016-09-20 DIAGNOSIS — M79645 Pain in left finger(s): Secondary | ICD-10-CM | POA: Diagnosis not present

## 2016-10-07 DIAGNOSIS — W5501XD Bitten by cat, subsequent encounter: Secondary | ICD-10-CM | POA: Diagnosis not present

## 2016-10-07 DIAGNOSIS — M79645 Pain in left finger(s): Secondary | ICD-10-CM | POA: Diagnosis not present

## 2016-10-07 DIAGNOSIS — Z6830 Body mass index (BMI) 30.0-30.9, adult: Secondary | ICD-10-CM | POA: Diagnosis not present

## 2016-11-07 DIAGNOSIS — S61452A Open bite of left hand, initial encounter: Secondary | ICD-10-CM | POA: Diagnosis not present

## 2016-11-07 DIAGNOSIS — W5501XA Bitten by cat, initial encounter: Secondary | ICD-10-CM | POA: Diagnosis not present

## 2016-11-29 DIAGNOSIS — Z961 Presence of intraocular lens: Secondary | ICD-10-CM | POA: Diagnosis not present

## 2016-12-28 DIAGNOSIS — W5501XD Bitten by cat, subsequent encounter: Secondary | ICD-10-CM | POA: Diagnosis not present

## 2016-12-28 DIAGNOSIS — S61452D Open bite of left hand, subsequent encounter: Secondary | ICD-10-CM | POA: Diagnosis not present

## 2017-01-01 DIAGNOSIS — E7800 Pure hypercholesterolemia, unspecified: Secondary | ICD-10-CM | POA: Diagnosis not present

## 2017-01-01 DIAGNOSIS — R739 Hyperglycemia, unspecified: Secondary | ICD-10-CM | POA: Diagnosis not present

## 2017-01-01 DIAGNOSIS — E782 Mixed hyperlipidemia: Secondary | ICD-10-CM | POA: Diagnosis not present

## 2017-01-01 DIAGNOSIS — I10 Essential (primary) hypertension: Secondary | ICD-10-CM | POA: Diagnosis not present

## 2017-01-01 DIAGNOSIS — E039 Hypothyroidism, unspecified: Secondary | ICD-10-CM | POA: Diagnosis not present

## 2017-01-01 DIAGNOSIS — N189 Chronic kidney disease, unspecified: Secondary | ICD-10-CM | POA: Diagnosis not present

## 2017-01-01 DIAGNOSIS — E119 Type 2 diabetes mellitus without complications: Secondary | ICD-10-CM | POA: Diagnosis not present

## 2017-01-01 DIAGNOSIS — E785 Hyperlipidemia, unspecified: Secondary | ICD-10-CM | POA: Diagnosis not present

## 2017-01-01 DIAGNOSIS — K219 Gastro-esophageal reflux disease without esophagitis: Secondary | ICD-10-CM | POA: Diagnosis not present

## 2017-01-05 DIAGNOSIS — E782 Mixed hyperlipidemia: Secondary | ICD-10-CM | POA: Diagnosis not present

## 2017-01-05 DIAGNOSIS — R739 Hyperglycemia, unspecified: Secondary | ICD-10-CM | POA: Diagnosis not present

## 2017-01-05 DIAGNOSIS — I10 Essential (primary) hypertension: Secondary | ICD-10-CM | POA: Diagnosis not present

## 2017-01-05 DIAGNOSIS — E785 Hyperlipidemia, unspecified: Secondary | ICD-10-CM | POA: Diagnosis not present

## 2017-01-05 DIAGNOSIS — E039 Hypothyroidism, unspecified: Secondary | ICD-10-CM | POA: Diagnosis not present

## 2017-01-05 DIAGNOSIS — Z6830 Body mass index (BMI) 30.0-30.9, adult: Secondary | ICD-10-CM | POA: Diagnosis not present

## 2017-01-05 DIAGNOSIS — M2550 Pain in unspecified joint: Secondary | ICD-10-CM | POA: Diagnosis not present

## 2017-01-05 DIAGNOSIS — N189 Chronic kidney disease, unspecified: Secondary | ICD-10-CM | POA: Diagnosis not present

## 2017-02-21 DIAGNOSIS — Z6830 Body mass index (BMI) 30.0-30.9, adult: Secondary | ICD-10-CM | POA: Diagnosis not present

## 2017-02-21 DIAGNOSIS — R05 Cough: Secondary | ICD-10-CM | POA: Diagnosis not present

## 2017-02-21 DIAGNOSIS — J0100 Acute maxillary sinusitis, unspecified: Secondary | ICD-10-CM | POA: Diagnosis not present

## 2017-04-30 DIAGNOSIS — E782 Mixed hyperlipidemia: Secondary | ICD-10-CM | POA: Diagnosis not present

## 2017-04-30 DIAGNOSIS — Z6830 Body mass index (BMI) 30.0-30.9, adult: Secondary | ICD-10-CM | POA: Diagnosis not present

## 2017-04-30 DIAGNOSIS — E7800 Pure hypercholesterolemia, unspecified: Secondary | ICD-10-CM | POA: Diagnosis not present

## 2017-04-30 DIAGNOSIS — Z0000 Encounter for general adult medical examination without abnormal findings: Secondary | ICD-10-CM | POA: Diagnosis not present

## 2017-04-30 DIAGNOSIS — E039 Hypothyroidism, unspecified: Secondary | ICD-10-CM | POA: Diagnosis not present

## 2017-04-30 DIAGNOSIS — K219 Gastro-esophageal reflux disease without esophagitis: Secondary | ICD-10-CM | POA: Diagnosis not present

## 2017-04-30 DIAGNOSIS — E119 Type 2 diabetes mellitus without complications: Secondary | ICD-10-CM | POA: Diagnosis not present

## 2017-04-30 DIAGNOSIS — I10 Essential (primary) hypertension: Secondary | ICD-10-CM | POA: Diagnosis not present

## 2017-04-30 DIAGNOSIS — R739 Hyperglycemia, unspecified: Secondary | ICD-10-CM | POA: Diagnosis not present

## 2017-04-30 DIAGNOSIS — E785 Hyperlipidemia, unspecified: Secondary | ICD-10-CM | POA: Diagnosis not present

## 2017-04-30 DIAGNOSIS — F419 Anxiety disorder, unspecified: Secondary | ICD-10-CM | POA: Diagnosis not present

## 2017-04-30 DIAGNOSIS — N189 Chronic kidney disease, unspecified: Secondary | ICD-10-CM | POA: Diagnosis not present

## 2017-05-02 DIAGNOSIS — R739 Hyperglycemia, unspecified: Secondary | ICD-10-CM | POA: Diagnosis not present

## 2017-05-02 DIAGNOSIS — Z6831 Body mass index (BMI) 31.0-31.9, adult: Secondary | ICD-10-CM | POA: Diagnosis not present

## 2017-05-02 DIAGNOSIS — I10 Essential (primary) hypertension: Secondary | ICD-10-CM | POA: Diagnosis not present

## 2017-05-02 DIAGNOSIS — E039 Hypothyroidism, unspecified: Secondary | ICD-10-CM | POA: Diagnosis not present

## 2017-05-02 DIAGNOSIS — E782 Mixed hyperlipidemia: Secondary | ICD-10-CM | POA: Diagnosis not present

## 2017-05-02 DIAGNOSIS — E785 Hyperlipidemia, unspecified: Secondary | ICD-10-CM | POA: Diagnosis not present

## 2017-05-02 DIAGNOSIS — Z23 Encounter for immunization: Secondary | ICD-10-CM | POA: Diagnosis not present

## 2017-05-02 DIAGNOSIS — N189 Chronic kidney disease, unspecified: Secondary | ICD-10-CM | POA: Diagnosis not present

## 2017-08-16 DIAGNOSIS — F419 Anxiety disorder, unspecified: Secondary | ICD-10-CM | POA: Diagnosis not present

## 2017-08-16 DIAGNOSIS — N189 Chronic kidney disease, unspecified: Secondary | ICD-10-CM | POA: Diagnosis not present

## 2017-08-16 DIAGNOSIS — K219 Gastro-esophageal reflux disease without esophagitis: Secondary | ICD-10-CM | POA: Diagnosis not present

## 2017-08-16 DIAGNOSIS — E7800 Pure hypercholesterolemia, unspecified: Secondary | ICD-10-CM | POA: Diagnosis not present

## 2017-08-16 DIAGNOSIS — I10 Essential (primary) hypertension: Secondary | ICD-10-CM | POA: Diagnosis not present

## 2017-08-16 DIAGNOSIS — E785 Hyperlipidemia, unspecified: Secondary | ICD-10-CM | POA: Diagnosis not present

## 2017-08-16 DIAGNOSIS — E782 Mixed hyperlipidemia: Secondary | ICD-10-CM | POA: Diagnosis not present

## 2017-08-16 DIAGNOSIS — R739 Hyperglycemia, unspecified: Secondary | ICD-10-CM | POA: Diagnosis not present

## 2017-08-16 DIAGNOSIS — E119 Type 2 diabetes mellitus without complications: Secondary | ICD-10-CM | POA: Diagnosis not present

## 2017-09-11 DIAGNOSIS — L989 Disorder of the skin and subcutaneous tissue, unspecified: Secondary | ICD-10-CM | POA: Diagnosis not present

## 2017-09-11 DIAGNOSIS — L814 Other melanin hyperpigmentation: Secondary | ICD-10-CM | POA: Diagnosis not present

## 2017-09-11 DIAGNOSIS — D485 Neoplasm of uncertain behavior of skin: Secondary | ICD-10-CM | POA: Diagnosis not present

## 2017-09-11 DIAGNOSIS — D2239 Melanocytic nevi of other parts of face: Secondary | ICD-10-CM | POA: Diagnosis not present

## 2017-10-02 DIAGNOSIS — F329 Major depressive disorder, single episode, unspecified: Secondary | ICD-10-CM | POA: Diagnosis not present

## 2017-10-02 DIAGNOSIS — Z7982 Long term (current) use of aspirin: Secondary | ICD-10-CM | POA: Diagnosis not present

## 2017-10-02 DIAGNOSIS — R296 Repeated falls: Secondary | ICD-10-CM | POA: Diagnosis not present

## 2017-10-02 DIAGNOSIS — E039 Hypothyroidism, unspecified: Secondary | ICD-10-CM | POA: Diagnosis not present

## 2017-10-02 DIAGNOSIS — Z780 Asymptomatic menopausal state: Secondary | ICD-10-CM | POA: Diagnosis not present

## 2017-10-02 DIAGNOSIS — Z79899 Other long term (current) drug therapy: Secondary | ICD-10-CM | POA: Diagnosis not present

## 2017-10-02 DIAGNOSIS — M8580 Other specified disorders of bone density and structure, unspecified site: Secondary | ICD-10-CM | POA: Diagnosis not present

## 2017-10-02 DIAGNOSIS — Z85820 Personal history of malignant melanoma of skin: Secondary | ICD-10-CM | POA: Diagnosis not present

## 2017-10-02 DIAGNOSIS — E7800 Pure hypercholesterolemia, unspecified: Secondary | ICD-10-CM | POA: Diagnosis not present

## 2017-10-23 DIAGNOSIS — D485 Neoplasm of uncertain behavior of skin: Secondary | ICD-10-CM | POA: Diagnosis not present

## 2017-10-23 DIAGNOSIS — L905 Scar conditions and fibrosis of skin: Secondary | ICD-10-CM | POA: Diagnosis not present

## 2017-12-17 DIAGNOSIS — K219 Gastro-esophageal reflux disease without esophagitis: Secondary | ICD-10-CM | POA: Diagnosis not present

## 2017-12-17 DIAGNOSIS — E119 Type 2 diabetes mellitus without complications: Secondary | ICD-10-CM | POA: Diagnosis not present

## 2017-12-17 DIAGNOSIS — I10 Essential (primary) hypertension: Secondary | ICD-10-CM | POA: Diagnosis not present

## 2017-12-17 DIAGNOSIS — E7800 Pure hypercholesterolemia, unspecified: Secondary | ICD-10-CM | POA: Diagnosis not present

## 2017-12-17 DIAGNOSIS — E782 Mixed hyperlipidemia: Secondary | ICD-10-CM | POA: Diagnosis not present

## 2017-12-17 DIAGNOSIS — E785 Hyperlipidemia, unspecified: Secondary | ICD-10-CM | POA: Diagnosis not present

## 2017-12-17 DIAGNOSIS — E039 Hypothyroidism, unspecified: Secondary | ICD-10-CM | POA: Diagnosis not present

## 2017-12-17 DIAGNOSIS — R739 Hyperglycemia, unspecified: Secondary | ICD-10-CM | POA: Diagnosis not present

## 2017-12-19 DIAGNOSIS — Z961 Presence of intraocular lens: Secondary | ICD-10-CM | POA: Diagnosis not present

## 2017-12-21 DIAGNOSIS — E039 Hypothyroidism, unspecified: Secondary | ICD-10-CM | POA: Diagnosis not present

## 2017-12-21 DIAGNOSIS — E785 Hyperlipidemia, unspecified: Secondary | ICD-10-CM | POA: Diagnosis not present

## 2017-12-21 DIAGNOSIS — G4700 Insomnia, unspecified: Secondary | ICD-10-CM | POA: Diagnosis not present

## 2017-12-21 DIAGNOSIS — I10 Essential (primary) hypertension: Secondary | ICD-10-CM | POA: Diagnosis not present

## 2017-12-21 DIAGNOSIS — R739 Hyperglycemia, unspecified: Secondary | ICD-10-CM | POA: Diagnosis not present

## 2017-12-21 DIAGNOSIS — N189 Chronic kidney disease, unspecified: Secondary | ICD-10-CM | POA: Diagnosis not present

## 2017-12-21 DIAGNOSIS — E782 Mixed hyperlipidemia: Secondary | ICD-10-CM | POA: Diagnosis not present

## 2017-12-21 DIAGNOSIS — Z6831 Body mass index (BMI) 31.0-31.9, adult: Secondary | ICD-10-CM | POA: Diagnosis not present

## 2018-03-12 DIAGNOSIS — M5417 Radiculopathy, lumbosacral region: Secondary | ICD-10-CM | POA: Diagnosis not present

## 2018-03-12 DIAGNOSIS — M4316 Spondylolisthesis, lumbar region: Secondary | ICD-10-CM | POA: Diagnosis not present

## 2018-03-12 DIAGNOSIS — M545 Low back pain: Secondary | ICD-10-CM | POA: Diagnosis not present

## 2018-03-20 DIAGNOSIS — M9905 Segmental and somatic dysfunction of pelvic region: Secondary | ICD-10-CM | POA: Diagnosis not present

## 2018-03-22 DIAGNOSIS — M9905 Segmental and somatic dysfunction of pelvic region: Secondary | ICD-10-CM | POA: Diagnosis not present

## 2018-03-25 DIAGNOSIS — M9905 Segmental and somatic dysfunction of pelvic region: Secondary | ICD-10-CM | POA: Diagnosis not present

## 2018-03-30 DIAGNOSIS — M545 Low back pain: Secondary | ICD-10-CM | POA: Diagnosis not present

## 2018-04-04 DIAGNOSIS — M419 Scoliosis, unspecified: Secondary | ICD-10-CM | POA: Diagnosis not present

## 2018-04-04 DIAGNOSIS — M5417 Radiculopathy, lumbosacral region: Secondary | ICD-10-CM | POA: Diagnosis not present

## 2018-04-04 DIAGNOSIS — M4310 Spondylolisthesis, site unspecified: Secondary | ICD-10-CM | POA: Diagnosis not present

## 2018-04-04 DIAGNOSIS — M545 Low back pain: Secondary | ICD-10-CM | POA: Diagnosis not present

## 2018-04-23 DIAGNOSIS — M48061 Spinal stenosis, lumbar region without neurogenic claudication: Secondary | ICD-10-CM | POA: Diagnosis not present

## 2018-04-23 DIAGNOSIS — M4310 Spondylolisthesis, site unspecified: Secondary | ICD-10-CM | POA: Diagnosis not present

## 2018-04-24 DIAGNOSIS — E782 Mixed hyperlipidemia: Secondary | ICD-10-CM | POA: Diagnosis not present

## 2018-04-24 DIAGNOSIS — F419 Anxiety disorder, unspecified: Secondary | ICD-10-CM | POA: Diagnosis not present

## 2018-04-24 DIAGNOSIS — E119 Type 2 diabetes mellitus without complications: Secondary | ICD-10-CM | POA: Diagnosis not present

## 2018-04-24 DIAGNOSIS — E785 Hyperlipidemia, unspecified: Secondary | ICD-10-CM | POA: Diagnosis not present

## 2018-04-24 DIAGNOSIS — E7800 Pure hypercholesterolemia, unspecified: Secondary | ICD-10-CM | POA: Diagnosis not present

## 2018-04-24 DIAGNOSIS — I10 Essential (primary) hypertension: Secondary | ICD-10-CM | POA: Diagnosis not present

## 2018-04-24 DIAGNOSIS — K219 Gastro-esophageal reflux disease without esophagitis: Secondary | ICD-10-CM | POA: Diagnosis not present

## 2018-04-26 DIAGNOSIS — I10 Essential (primary) hypertension: Secondary | ICD-10-CM | POA: Diagnosis not present

## 2018-04-26 DIAGNOSIS — E039 Hypothyroidism, unspecified: Secondary | ICD-10-CM | POA: Diagnosis not present

## 2018-04-26 DIAGNOSIS — R079 Chest pain, unspecified: Secondary | ICD-10-CM | POA: Diagnosis not present

## 2018-04-26 DIAGNOSIS — N189 Chronic kidney disease, unspecified: Secondary | ICD-10-CM | POA: Diagnosis not present

## 2018-04-26 DIAGNOSIS — E785 Hyperlipidemia, unspecified: Secondary | ICD-10-CM | POA: Diagnosis not present

## 2018-04-26 DIAGNOSIS — Z23 Encounter for immunization: Secondary | ICD-10-CM | POA: Diagnosis not present

## 2018-04-26 DIAGNOSIS — E782 Mixed hyperlipidemia: Secondary | ICD-10-CM | POA: Diagnosis not present

## 2018-04-26 DIAGNOSIS — M2550 Pain in unspecified joint: Secondary | ICD-10-CM | POA: Diagnosis not present

## 2018-05-07 DIAGNOSIS — M4310 Spondylolisthesis, site unspecified: Secondary | ICD-10-CM | POA: Diagnosis not present

## 2018-05-07 DIAGNOSIS — M48061 Spinal stenosis, lumbar region without neurogenic claudication: Secondary | ICD-10-CM | POA: Diagnosis not present

## 2018-05-22 DIAGNOSIS — M25552 Pain in left hip: Secondary | ICD-10-CM | POA: Diagnosis not present

## 2018-05-22 DIAGNOSIS — M25559 Pain in unspecified hip: Secondary | ICD-10-CM | POA: Diagnosis not present

## 2018-08-16 DIAGNOSIS — I10 Essential (primary) hypertension: Secondary | ICD-10-CM | POA: Diagnosis not present

## 2018-08-16 DIAGNOSIS — E785 Hyperlipidemia, unspecified: Secondary | ICD-10-CM | POA: Diagnosis not present

## 2018-08-16 DIAGNOSIS — E119 Type 2 diabetes mellitus without complications: Secondary | ICD-10-CM | POA: Diagnosis not present

## 2018-08-16 DIAGNOSIS — E782 Mixed hyperlipidemia: Secondary | ICD-10-CM | POA: Diagnosis not present

## 2018-08-16 DIAGNOSIS — E7800 Pure hypercholesterolemia, unspecified: Secondary | ICD-10-CM | POA: Diagnosis not present

## 2018-08-16 DIAGNOSIS — E039 Hypothyroidism, unspecified: Secondary | ICD-10-CM | POA: Diagnosis not present

## 2018-08-16 DIAGNOSIS — R739 Hyperglycemia, unspecified: Secondary | ICD-10-CM | POA: Diagnosis not present

## 2018-08-16 DIAGNOSIS — N189 Chronic kidney disease, unspecified: Secondary | ICD-10-CM | POA: Diagnosis not present

## 2018-08-16 DIAGNOSIS — K219 Gastro-esophageal reflux disease without esophagitis: Secondary | ICD-10-CM | POA: Diagnosis not present

## 2018-08-20 DIAGNOSIS — F419 Anxiety disorder, unspecified: Secondary | ICD-10-CM | POA: Diagnosis not present

## 2018-08-20 DIAGNOSIS — N189 Chronic kidney disease, unspecified: Secondary | ICD-10-CM | POA: Diagnosis not present

## 2018-08-20 DIAGNOSIS — E785 Hyperlipidemia, unspecified: Secondary | ICD-10-CM | POA: Diagnosis not present

## 2018-08-20 DIAGNOSIS — E039 Hypothyroidism, unspecified: Secondary | ICD-10-CM | POA: Diagnosis not present

## 2018-08-20 DIAGNOSIS — I10 Essential (primary) hypertension: Secondary | ICD-10-CM | POA: Diagnosis not present

## 2018-08-20 DIAGNOSIS — E782 Mixed hyperlipidemia: Secondary | ICD-10-CM | POA: Diagnosis not present

## 2018-08-20 DIAGNOSIS — Z6831 Body mass index (BMI) 31.0-31.9, adult: Secondary | ICD-10-CM | POA: Diagnosis not present

## 2018-08-20 DIAGNOSIS — R739 Hyperglycemia, unspecified: Secondary | ICD-10-CM | POA: Diagnosis not present

## 2018-09-05 DIAGNOSIS — Z1231 Encounter for screening mammogram for malignant neoplasm of breast: Secondary | ICD-10-CM | POA: Diagnosis not present

## 2018-09-10 DIAGNOSIS — D229 Melanocytic nevi, unspecified: Secondary | ICD-10-CM | POA: Diagnosis not present

## 2018-10-02 DIAGNOSIS — M25552 Pain in left hip: Secondary | ICD-10-CM | POA: Diagnosis not present

## 2018-10-10 ENCOUNTER — Encounter: Payer: Self-pay | Admitting: General Surgery

## 2018-10-10 ENCOUNTER — Ambulatory Visit: Payer: Medicare PPO | Admitting: General Surgery

## 2018-10-10 ENCOUNTER — Other Ambulatory Visit: Payer: Self-pay

## 2018-10-10 VITALS — BP 110/64 | HR 66 | Temp 97.7°F | Resp 16 | Wt 185.6 lb

## 2018-10-10 DIAGNOSIS — R195 Other fecal abnormalities: Secondary | ICD-10-CM

## 2018-10-10 MED ORDER — PEG 3350-KCL-NABCB-NACL-NASULF 236 G PO SOLR: 4000.0000 mL | Freq: Once | ORAL | 0 refills | Status: AC

## 2018-10-10 NOTE — H&P (Signed)
Penny Morgan; [id number]; August 21, 1945   HPI Patient is a pleasant 73 year old white female who was referred to my care by Kassie Mends for evaluation treatment of occult blood per rectum.  A Cologuard test was positive.  She last had a colonoscopy in 2014 which was reportedly unremarkable.  She denies any family history of colon cancer.  She does have a history of diverticulosis, but has had no abdominal complaints for some time now.  She denies any fever or chills.  She currently has 0 out of 10 abdominal pain.  She denies any abnormal diarrhea or constipation.  She is very active. Past Medical History:  Diagnosis Date  . Anxiety   . Arthritis   . Carpal tunnel syndrome   . Chest congestion    CHRONIC  . Chronic kidney disease    STAGE III KIDNEY DISEASE  . Contact lens/glasses fitting    contacts or glasses  . Depression   . Difficulty sleeping   . GERD (gastroesophageal reflux disease)   . Headache(784.0)    MIGRAINES  . Hypertension    TAKES BP MED "FOR KIDNEYS" HAS HAD ELEVATED BP IN GTHE PAST  . Melanoma ([place]) 2 YRS AGO    Past Surgical History:  Procedure Laterality Date  . CARPAL TUNNEL RELEASE Right 12/05/2012   Procedure: CARPAL TUNNEL RELEASE;  Surgeon: Cammie Sickle., MD;  Location: [facility];  Service: Orthopedics;  Laterality: Right;  . CARPAL TUNNEL RELEASE Left 10/21/2013   Procedure: CARPAL TUNNEL RELEASE;  Surgeon: Cammie Sickle., MD;  Location: [facility];  Service: Orthopedics;  Laterality: Left;  . CARPOMETACARPEL SUSPENSION PLASTY Left 10/21/2013   Procedure: TRAPEZIECTOMY FLEXOR CARPI RADIALIS KNOT PLASTY;  Surgeon: Cammie Sickle., MD;  Location: [facility];  Service: Orthopedics;  Laterality: Left;  . KNEE ARTHROSCOPY     L KNEE  . MELANOMA EXCISION  2011   back  . PARTIAL KNEE ARTHROPLASTY  08/05/2012   Procedure: UNICOMPARTMENTAL KNEE;  Surgeon: Mauri Pole, MD;  Location: [facility];  Service:  Orthopedics;  Laterality: Left;  . TONSILLECTOMY    . TRIGGER FINGER RELEASE     L HAND  . TRIGGER FINGER RELEASE Right 12/05/2012   Procedure: RELEASE TRIGGER FINGER/A-1 PULLEY RIGHT RING FINGER;  Surgeon: Cammie Sickle., MD;  Location: [facility];  Service: Orthopedics;  Laterality: Right;  . TRIGGER FINGER RELEASE Left 10/21/2013   Procedure: LEFT RING  STENOSING TENOSYNOVITIS RELEASE AND  LEFT THUMB;  Surgeon: Cammie Sickle., MD;  Location: [facility];  Service: Orthopedics;  Laterality: Left;    History reviewed. No pertinent family history.  Current Outpatient Medications on File Prior to Visit  Medication Sig Dispense Refill  . aspirin EC 81 MG tablet Take 1 tablet (81 mg total) by mouth every morning.    Marland Kitchen atorvastatin (LIPITOR) 20 MG tablet Take 20 mg by mouth every morning.    . furosemide (LASIX) 20 MG tablet Take 20 mg by mouth daily as needed. For swelling    . levothyroxine (SYNTHROID, LEVOTHROID) 88 MCG tablet Take 88 mcg by mouth every other day.    Marland Kitchen LORazepam (ATIVAN) 1 MG tablet Take 1 mg by mouth every 8 (eight) hours as needed.    Marland Kitchen losartan (COZAAR) 50 MG tablet Take 20 mg by mouth daily.     . Multiple Vitamin (MULTIVITAMIN WITH MINERALS) TABS Take 1 tablet by mouth daily.    Marland Kitchen  omeprazole (PRILOSEC) 20 MG capsule Take 20 mg by mouth daily.    . Potassium 99 MG TABS Take 99 mg by mouth daily.    . rizatriptan (MAXALT-MLT) 10 MG disintegrating tablet Take 10 mg by mouth as needed. May repeat in 2 hours if needed for migraine    . traMADol (ULTRAM) 50 MG tablet Take by mouth every 6 (six) hours as needed.    . venlafaxine (EFFEXOR) 75 MG tablet Take 75 mg by mouth 2 (two) times daily.    . montelukast (SINGULAIR) 10 MG tablet Take 10 mg by mouth every morning.     No current facility-administered medications on file prior to visit.     Allergies  Allergen Reactions  . Oxycodone Nausea And Vomiting  . Sulfa Antibiotics Rash     Turned red on legs    Social History   Substance and Sexual Activity  Alcohol Use Yes   Comment: OCCASIONAL    Social History   Tobacco Use  Smoking Status Passive Smoke Exposure - Never Smoker  Smokeless Tobacco Never Used    Review of Systems  Constitutional: Negative.   HENT: Positive for sinus pain.   Eyes: Negative.   Respiratory: Negative.   Cardiovascular: Negative.   Gastrointestinal: Positive for heartburn.  Genitourinary: Negative.   Musculoskeletal: Positive for back pain, joint pain and neck pain.  Skin: Negative.   Neurological: Negative.   Endo/Heme/Allergies: Negative.   Psychiatric/Behavioral: Negative.     Objective   Vitals:   10/10/18 1108  BP: 110/64  Pulse: 66  Resp: 16  Temp: 97.7 F (36.5 C)    Physical Exam Vitals signs reviewed.  Constitutional:      Appearance: Normal appearance. She is normal weight. She is not ill-appearing.  HENT:     Head: Normocephalic and atraumatic.  Cardiovascular:     Rate and Rhythm: Normal rate and regular rhythm.     Heart sounds: Normal heart sounds. No murmur. No friction rub. No gallop.   Pulmonary:     Effort: Pulmonary effort is normal. No respiratory distress.     Breath sounds: Normal breath sounds. No stridor. No wheezing, rhonchi or rales.  Abdominal:     General: Abdomen is flat. Bowel sounds are normal. There is no distension.     Palpations: Abdomen is soft. There is no mass.     Tenderness: There is no abdominal tenderness. There is no guarding or rebound.  Skin:    General: Skin is warm and dry.  Neurological:     Mental Status: She is alert and oriented to person, place, and time.    Primary care notes reviewed Assessment   Occult blood in stools  Plan   Patient is scheduled for a colonoscopy on 10/15/2018.  The risks and benefits of the procedure including bleeding and perforation were fully explained to the patient, who gave informed consent.  GoLYTELY prep has been  prescribed.

## 2018-10-10 NOTE — Patient Instructions (Signed)
Colonoscopy, Adult A colonoscopy is an exam to look at the entire large intestine. During the exam, a lubricated, flexible tube that has a camera on the end of it is inserted into the anus and then passed into the rectum, colon, and other parts of the large intestine. You may have a colonoscopy as a part of normal colorectal screening or if you have certain symptoms, such as:  Lack of red blood cells (anemia).  Diarrhea that does not go away.  Abdominal pain.  Blood in your stool (feces). A colonoscopy can help screen for and diagnose medical problems, including:  Tumors.  Polyps.  Inflammation.  Areas of bleeding. Tell a health care provider about:  Any allergies you have.  All medicines you are taking, including vitamins, herbs, eye drops, creams, and over-the-counter medicines.  Any problems you or family members have had with anesthetic medicines.  Any blood disorders you have.  Any surgeries you have had.  Any medical conditions you have.  Any problems you have had passing stool. What are the risks? Generally, this is a safe procedure. However, problems may occur, including:  Bleeding.  A tear in the intestine.  A reaction to medicines given during the exam.  Infection (rare). What happens before the procedure? Eating and drinking restrictions Follow instructions from your health care provider about eating and drinking, which may include:  A few days before the procedure - follow a low-fiber diet. Avoid nuts, seeds, dried fruit, raw fruits, and vegetables.  1-3 days before the procedure - follow a clear liquid diet. Drink only clear liquids, such as clear broth or bouillon, black coffee or tea, clear juice, clear soft drinks or sports drinks, gelatin dessert, and popsicles. Avoid any liquids that contain red or purple dye.  On the day of the procedure - do not eat or drink anything starting 2 hours before the procedure, or within the time period that your  health care provider recommends. Up to 2 hours before the procedure, you may continue to drink clear liquids, such as water or clear fruit juice. Bowel prep If you were prescribed an oral bowel prep to clean out your colon:  Take it as told by your health care provider. Starting the day before your procedure, you will need to drink a large amount of medicated liquid. The liquid will cause you to have multiple loose stools until your stool is almost clear or light green.  If your skin or anus gets irritated from diarrhea, you may use these to relieve the irritation: ? Medicated wipes, such as adult wet wipes with aloe and vitamin E. ? A skin-soothing product like petroleum jelly.  If you vomit while drinking the bowel prep, take a break for up to 60 minutes and then begin the bowel prep again. If vomiting continues and you cannot take the bowel prep without vomiting, call your health care provider.  To clean out your colon, you may also be given: ? Laxative medicines. ? Instructions about how to use an enema. General instructions  Ask your health care provider about: ? Changing or stopping your regular medicines or supplements. This is especially important if you are taking iron supplements, diabetes medicines, or blood thinners. ? Taking medicines such as aspirin and ibuprofen. These medicines can thin your blood. Do not take these medicines before the procedure if your health care provider tells you not to.  Plan to have someone take you home from the hospital or clinic. What happens during the procedure?     An IV may be inserted into one of your veins.  You will be given medicine to help you relax (sedative).  To reduce your risk of infection: ? Your health care team will wash or sanitize their hands. ? Your anal area will be washed with soap.  You will be asked to lie on your side with your knees bent.  Your health care provider will lubricate a long, thin, flexible tube. The  tube will have a camera and a light on the end.  The tube will be inserted into your anus.  The tube will be gently eased through your rectum and colon.  Air will be delivered into your colon to keep it open. You may feel some pressure or cramping.  The camera will be used to take images during the procedure.  A small tissue sample may be removed to be examined under a microscope (biopsy).  If small polyps are found, your health care provider may remove them and have them checked for cancer cells.  When the exam is done, the tube will be removed. The procedure may vary among health care providers and hospitals. What happens after the procedure?  Your blood pressure, heart rate, breathing rate, and blood oxygen level will be monitored until the medicines you were given have worn off.  Do not drive for 24 hours after the exam.  You may have a small amount of blood in your stool.  You may pass gas and have mild abdominal cramping or bloating due to the air that was used to inflate your colon during the exam.  It is up to you to get the results of your procedure. Ask your health care provider, or the department performing the procedure, when your results will be ready. Summary  A colonoscopy is an exam to look at the entire large intestine.  During a colonoscopy, a lubricated, flexible tube with a camera on the end of it is inserted into the anus and then passed into the colon and other parts of the large intestine.  Follow instructions from your health care provider about eating and drinking before the procedure.  If you were prescribed an oral bowel prep to clean out your colon, take it as told by your health care provider.  After your procedure, your blood pressure, heart rate, breathing rate, and blood oxygen level will be monitored until the medicines you were given have worn off. This information is not intended to replace advice given to you by your health care provider. Make  sure you discuss any questions you have with your health care provider. Document Released: 07/14/2000 Document Revised: 05/09/2017 Document Reviewed: 09/28/2015 Elsevier Interactive Patient Education  2019 Elsevier Inc.  

## 2018-10-10 NOTE — Progress Notes (Signed)
Penny Morgan; [id number]; Sep 30, 1945   HPI Patient is a pleasant 73 year old white female who was referred to my care by Kassie Mends for evaluation treatment of occult blood per rectum.  A Cologuard test was positive.  She last had a colonoscopy in 2014 which was reportedly unremarkable.  She denies any family history of colon cancer.  She does have a history of diverticulosis, but has had no abdominal complaints for some time now.  She denies any fever or chills.  She currently has 0 out of 10 abdominal pain.  She denies any abnormal diarrhea or constipation.  She is very active. Past Medical History:  Diagnosis Date  . Anxiety   . Arthritis   . Carpal tunnel syndrome   . Chest congestion    CHRONIC  . Chronic kidney disease    STAGE III KIDNEY DISEASE  . Contact lens/glasses fitting    contacts or glasses  . Depression   . Difficulty sleeping   . GERD (gastroesophageal reflux disease)   . Headache(784.0)    MIGRAINES  . Hypertension    TAKES BP MED "FOR KIDNEYS" HAS HAD ELEVATED BP IN GTHE PAST  . Melanoma ([place]) 2 YRS AGO    Past Surgical History:  Procedure Laterality Date  . CARPAL TUNNEL RELEASE Right 12/05/2012   Procedure: CARPAL TUNNEL RELEASE;  Surgeon: Cammie Sickle., MD;  Location: [facility];  Service: Orthopedics;  Laterality: Right;  . CARPAL TUNNEL RELEASE Left 10/21/2013   Procedure: CARPAL TUNNEL RELEASE;  Surgeon: Cammie Sickle., MD;  Location: [facility];  Service: Orthopedics;  Laterality: Left;  . CARPOMETACARPEL SUSPENSION PLASTY Left 10/21/2013   Procedure: TRAPEZIECTOMY FLEXOR CARPI RADIALIS KNOT PLASTY;  Surgeon: Cammie Sickle., MD;  Location: [facility];  Service: Orthopedics;  Laterality: Left;  . KNEE ARTHROSCOPY     L KNEE  . MELANOMA EXCISION  2011   back  . PARTIAL KNEE ARTHROPLASTY  08/05/2012   Procedure: UNICOMPARTMENTAL KNEE;  Surgeon: Mauri Pole, MD;  Location: [facility];  Service:  Orthopedics;  Laterality: Left;  . TONSILLECTOMY    . TRIGGER FINGER RELEASE     L HAND  . TRIGGER FINGER RELEASE Right 12/05/2012   Procedure: RELEASE TRIGGER FINGER/A-1 PULLEY RIGHT RING FINGER;  Surgeon: Cammie Sickle., MD;  Location: [facility];  Service: Orthopedics;  Laterality: Right;  . TRIGGER FINGER RELEASE Left 10/21/2013   Procedure: LEFT RING  STENOSING TENOSYNOVITIS RELEASE AND  LEFT THUMB;  Surgeon: Cammie Sickle., MD;  Location: [facility];  Service: Orthopedics;  Laterality: Left;    History reviewed. No pertinent family history.  Current Outpatient Medications on File Prior to Visit  Medication Sig Dispense Refill  . aspirin EC 81 MG tablet Take 1 tablet (81 mg total) by mouth every morning.    Marland Kitchen atorvastatin (LIPITOR) 20 MG tablet Take 20 mg by mouth every morning.    . furosemide (LASIX) 20 MG tablet Take 20 mg by mouth daily as needed. For swelling    . levothyroxine (SYNTHROID, LEVOTHROID) 88 MCG tablet Take 88 mcg by mouth every other day.    Marland Kitchen LORazepam (ATIVAN) 1 MG tablet Take 1 mg by mouth every 8 (eight) hours as needed.    Marland Kitchen losartan (COZAAR) 50 MG tablet Take 20 mg by mouth daily.     . Multiple Vitamin (MULTIVITAMIN WITH MINERALS) TABS Take 1 tablet by mouth daily.    Marland Kitchen  omeprazole (PRILOSEC) 20 MG capsule Take 20 mg by mouth daily.    . Potassium 99 MG TABS Take 99 mg by mouth daily.    . rizatriptan (MAXALT-MLT) 10 MG disintegrating tablet Take 10 mg by mouth as needed. May repeat in 2 hours if needed for migraine    . traMADol (ULTRAM) 50 MG tablet Take by mouth every 6 (six) hours as needed.    . venlafaxine (EFFEXOR) 75 MG tablet Take 75 mg by mouth 2 (two) times daily.    . montelukast (SINGULAIR) 10 MG tablet Take 10 mg by mouth every morning.     No current facility-administered medications on file prior to visit.     Allergies  Allergen Reactions  . Oxycodone Nausea And Vomiting  . Sulfa Antibiotics Rash     Turned red on legs    Social History   Substance and Sexual Activity  Alcohol Use Yes   Comment: OCCASIONAL    Social History   Tobacco Use  Smoking Status Passive Smoke Exposure - Never Smoker  Smokeless Tobacco Never Used    Review of Systems  Constitutional: Negative.   HENT: Positive for sinus pain.   Eyes: Negative.   Respiratory: Negative.   Cardiovascular: Negative.   Gastrointestinal: Positive for heartburn.  Genitourinary: Negative.   Musculoskeletal: Positive for back pain, joint pain and neck pain.  Skin: Negative.   Neurological: Negative.   Endo/Heme/Allergies: Negative.   Psychiatric/Behavioral: Negative.     Objective   Vitals:   10/10/18 1108  BP: 110/64  Pulse: 66  Resp: 16  Temp: 97.7 F (36.5 C)    Physical Exam Vitals signs reviewed.  Constitutional:      Appearance: Normal appearance. She is normal weight. She is not ill-appearing.  HENT:     Head: Normocephalic and atraumatic.  Cardiovascular:     Rate and Rhythm: Normal rate and regular rhythm.     Heart sounds: Normal heart sounds. No murmur. No friction rub. No gallop.   Pulmonary:     Effort: Pulmonary effort is normal. No respiratory distress.     Breath sounds: Normal breath sounds. No stridor. No wheezing, rhonchi or rales.  Abdominal:     General: Abdomen is flat. Bowel sounds are normal. There is no distension.     Palpations: Abdomen is soft. There is no mass.     Tenderness: There is no abdominal tenderness. There is no guarding or rebound.  Skin:    General: Skin is warm and dry.  Neurological:     Mental Status: She is alert and oriented to person, place, and time.    Primary care notes reviewed Assessment   Occult blood in stools  Plan   Patient is scheduled for a colonoscopy on 10/15/2018.  The risks and benefits of the procedure including bleeding and perforation were fully explained to the patient, who gave informed consent.  GoLYTELY prep has been  prescribed.

## 2018-10-15 ENCOUNTER — Ambulatory Visit (HOSPITAL_COMMUNITY)
Admission: RE | Admit: 2018-10-15 | Discharge: 2018-10-15 | Disposition: A | Discharge status: Home / Self Care | Payer: Medicare PPO | Attending: General Surgery | Admitting: General Surgery

## 2018-10-15 ENCOUNTER — Other Ambulatory Visit: Payer: Self-pay

## 2018-10-15 ENCOUNTER — Encounter (HOSPITAL_COMMUNITY): Payer: Self-pay | Admitting: *Deleted

## 2018-10-15 ENCOUNTER — Encounter (HOSPITAL_COMMUNITY)
Admission: RE | Disposition: A | Discharge status: Home / Self Care | Payer: Self-pay | Source: Home / Self Care | Attending: General Surgery

## 2018-10-15 DIAGNOSIS — I129 Hypertensive chronic kidney disease with stage 1 through stage 4 chronic kidney disease, or unspecified chronic kidney disease: Secondary | ICD-10-CM | POA: Insufficient documentation

## 2018-10-15 DIAGNOSIS — R195 Other fecal abnormalities: Secondary | ICD-10-CM | POA: Diagnosis not present

## 2018-10-15 DIAGNOSIS — N183 Chronic kidney disease, stage 3 (moderate): Secondary | ICD-10-CM | POA: Insufficient documentation

## 2018-10-15 DIAGNOSIS — Z7982 Long term (current) use of aspirin: Secondary | ICD-10-CM | POA: Insufficient documentation

## 2018-10-15 DIAGNOSIS — K649 Unspecified hemorrhoids: Secondary | ICD-10-CM | POA: Diagnosis not present

## 2018-10-15 DIAGNOSIS — M1990 Unspecified osteoarthritis, unspecified site: Secondary | ICD-10-CM | POA: Insufficient documentation

## 2018-10-15 DIAGNOSIS — Z85820 Personal history of malignant melanoma of skin: Secondary | ICD-10-CM | POA: Insufficient documentation

## 2018-10-15 DIAGNOSIS — K219 Gastro-esophageal reflux disease without esophagitis: Secondary | ICD-10-CM | POA: Diagnosis not present

## 2018-10-15 DIAGNOSIS — F329 Major depressive disorder, single episode, unspecified: Secondary | ICD-10-CM | POA: Insufficient documentation

## 2018-10-15 DIAGNOSIS — K5730 Diverticulosis of large intestine without perforation or abscess without bleeding: Secondary | ICD-10-CM | POA: Diagnosis not present

## 2018-10-15 DIAGNOSIS — Z79890 Hormone replacement therapy (postmenopausal): Secondary | ICD-10-CM | POA: Diagnosis not present

## 2018-10-15 DIAGNOSIS — Z79899 Other long term (current) drug therapy: Secondary | ICD-10-CM | POA: Diagnosis not present

## 2018-10-15 DIAGNOSIS — Q438 Other specified congenital malformations of intestine: Secondary | ICD-10-CM | POA: Insufficient documentation

## 2018-10-15 DIAGNOSIS — F419 Anxiety disorder, unspecified: Secondary | ICD-10-CM | POA: Insufficient documentation

## 2018-10-15 HISTORY — PX: COLONOSCOPY: SHX5424

## 2018-10-15 SURGERY — COLONOSCOPY
Anesthesia: Moderate Sedation

## 2018-10-15 MED ORDER — MEPERIDINE HCL 50 MG/ML IJ SOLN
INTRAMUSCULAR | Status: AC
  Filled 2018-10-15: qty 1

## 2018-10-15 MED ORDER — SODIUM CHLORIDE 0.9 % IV SOLN
INTRAVENOUS | Status: DC
  Administered 2018-10-15: 1000 mL via INTRAVENOUS

## 2018-10-15 MED ORDER — MEPERIDINE HCL 50 MG/ML IJ SOLN
INTRAMUSCULAR | Status: DC | PRN
  Administered 2018-10-15: 50 mg via INTRAVENOUS

## 2018-10-15 MED ORDER — MIDAZOLAM HCL 5 MG/5ML IJ SOLN
INTRAMUSCULAR | Status: DC | PRN
  Administered 2018-10-15: 1 mg via INTRAVENOUS
  Administered 2018-10-15: 2 mg via INTRAVENOUS
  Administered 2018-10-15: 1 mg via INTRAVENOUS

## 2018-10-15 MED ORDER — MIDAZOLAM HCL 5 MG/5ML IJ SOLN
INTRAMUSCULAR | Status: AC
  Filled 2018-10-15: qty 5

## 2018-10-15 MED ORDER — STERILE WATER FOR IRRIGATION IR SOLN
Status: DC | PRN
  Administered 2018-10-15: 08:00:00

## 2018-10-15 NOTE — Op Note (Signed)
[facility] Patient Name: Penny Morgan Procedure Date: 10/15/2018 7:04 AM MRN: [id number] Date of Birth: 1946/06/17 Attending MD: Aviva Signs , MD CSN: [id number] Age: 73 Admit Type: Outpatient Procedure:                Colonoscopy Indications:              Positive Cologuard test Providers:                Aviva Signs, MD, Otis Peak B. Gwenlyn Perking RN, RN, Aram Candela Referring MD:              Medicines:                Midazolam 4 mg IV, Meperidine 50 mg IV Complications:            No immediate complications. Estimated blood loss:                            None. Estimated Blood Loss:     Estimated blood loss: none. Procedure:                Pre-Anesthesia Assessment:                           - Prior to the procedure, a History and Physical                            was performed, and patient medications and                            allergies were reviewed. The patient is competent.                            The risks and benefits of the procedure and the                            sedation options and risks were discussed with the                            patient. All questions were answered and informed                            consent was obtained. Patient identification and                            proposed procedure were verified by the physician,                            the nurse and the technician in the endoscopy                            suite. Mental Status Examination: alert and  oriented. Airway Examination: normal oropharyngeal                            airway and neck mobility. Respiratory Examination:                            clear to auscultation. CV Examination: RRR, no                            murmurs, no S3 or S4. Prophylactic Antibiotics: The                            patient does not require prophylactic antibiotics.                            Prior Anticoagulants: The patient has taken                             aspirin, last dose was 1 day prior to procedure.                            ASA Grade Assessment: II - A patient with mild                            systemic disease. After reviewing the risks and                            benefits, the patient was deemed in satisfactory                            condition to undergo the procedure. The anesthesia                            plan was to use moderate sedation / analgesia                            (conscious sedation). Immediately prior to                            administration of medications, the patient was                            re-assessed for adequacy to receive sedatives. The                            heart rate, respiratory rate, oxygen saturations,                            blood pressure, adequacy of pulmonary ventilation,                            and response to care were monitored throughout the  procedure. The physical status of the patient was                            re-assessed after the procedure.                           After obtaining informed consent, the colonoscope                            was passed under direct vision. Throughout the                            procedure, the patient's blood pressure, pulse, and                            oxygen saturations were monitored continuously. The                            [id number]-[id number] ([id number]) scope was introduced through                            the anus and advanced to the the cecum, identified                            by the appendiceal orifice, ileocecal valve and                            palpation. No anatomical landmarks were                            photographed. The entire colon was well visualized.                            The colonoscopy was somewhat difficult due to a                            redundant colon. The patient tolerated the                            procedure well. The  quality of the bowel                            preparation was adequate. The total duration of the                            procedure was 17 minutes. Scope In: 7:34:06 AM Scope Out: 7:48:40 AM Scope Withdrawal Time: 0 hours 5 minutes 18 seconds  Total Procedure Duration: 0 hours 14 minutes 34 seconds  Findings:      Hemorrhoids were found on perianal exam.      The sigmoid colon was redundant. Estimated blood loss: none.      Multiple medium-mouthed diverticula were found in the sigmoid colon.       There was no evidence of diverticular bleeding. Estimated blood loss:  none.      The entire examined colon appeared normal on direct and retroflexion       views. Impression:               - Hemorrhoids found on perianal exam.                           - Redundant colon.                           - Moderate diverticulosis in the sigmoid colon.                            There was no evidence of diverticular bleeding.                           - The entire examined colon is normal on direct and                            retroflexion views.                           - No specimens collected. Moderate Sedation:      Moderate (conscious) sedation was administered by the endoscopy nurse       and supervised by the endoscopist. The following parameters were       monitored: oxygen saturation, heart rate, blood pressure, and response       to care. Recommendation:           - Written discharge instructions were provided to                            the patient.                           - The signs and symptoms of potential delayed                            complications were discussed with the patient.                           - Patient has a contact number available for                            emergencies.                           - Return to normal activities tomorrow.                           - Resume previous diet.                           - Continue present  medications.                           - No repeat colonoscopy due to current age (37  years or older). Procedure Code(s):        --- Professional ---                           [phone], Colonoscopy, flexible; diagnostic, including                            collection of specimen(s) by brushing or washing,                            when performed (separate procedure) Diagnosis Code(s):        --- Professional ---                           R19.5, Other fecal abnormalities                           K57.30, Diverticulosis of large intestine without                            perforation or abscess without bleeding                           K64.9, Unspecified hemorrhoids                           Q43.8, Other specified congenital malformations of                            intestine CPT copyright 2018 American Medical Association. All rights reserved. The codes documented in this report are preliminary and upon coder review may  be revised to meet current compliance requirements. Aviva Signs, MD Aviva Signs, MD 10/15/2018 7:55:45 AM This report has been signed electronically. Number of Addenda: 0

## 2018-10-15 NOTE — Discharge Instructions (Signed)
Colonoscopy, Adult, Care After  This sheet gives you information about how to care for yourself after your procedure. Your health care provider may also give you more specific instructions. If you have problems or questions, contact your health care provider.  What can I expect after the procedure?  After the procedure, it is common to have:  · A small amount of blood in your stool for 24 hours after the procedure.  · Some gas.  · Mild abdominal cramping or bloating.  Follow these instructions at home:  General instructions  · For the first 24 hours after the procedure:  ? Do not drive or use machinery.  ? Do not sign important documents.  ? Do not drink alcohol.  ? Do your regular daily activities at a slower pace than normal.  ? Eat soft, easy-to-digest foods.  · Take over-the-counter or prescription medicines only as told by your health care provider.  Relieving cramping and bloating    · Try walking around when you have cramps or feel bloated.  · Apply heat to your abdomen as told by your health care provider. Use a heat source that your health care provider recommends, such as a moist heat pack or a heating pad.  ? Place a towel between your skin and the heat source.  ? Leave the heat on for 20-30 minutes.  ? Remove the heat if your skin turns bright red. This is especially important if you are unable to feel pain, heat, or cold. You may have a greater risk of getting burned.  Eating and drinking    · Drink enough fluid to keep your urine pale yellow.  · Resume your normal diet as instructed by your health care provider. Avoid heavy or fried foods that are hard to digest.  · Avoid drinking alcohol for as long as instructed by your health care provider.  Contact a health care provider if:  · You have blood in your stool 2-3 days after the procedure.  Get help right away if:  · You have more than a small spotting of blood in your stool.  · You pass large blood clots in your stool.  · Your abdomen is  swollen.  · You have nausea or vomiting.  · You have a fever.  · You have increasing abdominal pain that is not relieved with medicine.  Summary  · After the procedure, it is common to have a small amount of blood in your stool. You may also have mild abdominal cramping and bloating.  · For the first 24 hours after the procedure, do not drive or use machinery, sign important documents, or drink alcohol.  · Contact your health care provider if you have a lot of blood in your stool, nausea or vomiting, a fever, or increased abdominal pain.  This information is not intended to replace advice given to you by your health care provider. Make sure you discuss any questions you have with your health care provider.  Document Released: 02/29/2004 Document Revised: 05/09/2017 Document Reviewed: 09/28/2015  Elsevier Interactive Patient Education © 2019 Elsevier Inc.

## 2018-10-15 NOTE — Interval H&P Note (Signed)
History and Physical Interval Note:  10/15/2018 7:30 AM  Penny Morgan  has presented today for surgery, with the diagnosis of occult blood.  The various methods of treatment have been discussed with the patient and family. After consideration of risks, benefits and other options for treatment, the patient has consented to  Procedure(s): COLONOSCOPY (N/A) as a surgical intervention.  The patient's history has been reviewed, patient examined, no change in status, stable for surgery.  I have reviewed the patient's chart and labs.  Questions were answered to the patient's satisfaction.     Aviva Signs

## 2018-10-17 ENCOUNTER — Encounter (HOSPITAL_COMMUNITY): Payer: Self-pay | Admitting: General Surgery

## 2018-12-24 DIAGNOSIS — E785 Hyperlipidemia, unspecified: Secondary | ICD-10-CM | POA: Diagnosis not present

## 2018-12-24 DIAGNOSIS — E782 Mixed hyperlipidemia: Secondary | ICD-10-CM | POA: Diagnosis not present

## 2018-12-24 DIAGNOSIS — E1165 Type 2 diabetes mellitus with hyperglycemia: Secondary | ICD-10-CM | POA: Diagnosis not present

## 2018-12-24 DIAGNOSIS — R739 Hyperglycemia, unspecified: Secondary | ICD-10-CM | POA: Diagnosis not present

## 2018-12-24 DIAGNOSIS — R5383 Other fatigue: Secondary | ICD-10-CM | POA: Diagnosis not present

## 2018-12-24 DIAGNOSIS — E039 Hypothyroidism, unspecified: Secondary | ICD-10-CM | POA: Diagnosis not present

## 2018-12-24 DIAGNOSIS — D519 Vitamin B12 deficiency anemia, unspecified: Secondary | ICD-10-CM | POA: Diagnosis not present

## 2018-12-24 DIAGNOSIS — K219 Gastro-esophageal reflux disease without esophagitis: Secondary | ICD-10-CM | POA: Diagnosis not present

## 2018-12-24 DIAGNOSIS — I10 Essential (primary) hypertension: Secondary | ICD-10-CM | POA: Diagnosis not present

## 2018-12-24 DIAGNOSIS — E119 Type 2 diabetes mellitus without complications: Secondary | ICD-10-CM | POA: Diagnosis not present

## 2018-12-24 DIAGNOSIS — N189 Chronic kidney disease, unspecified: Secondary | ICD-10-CM | POA: Diagnosis not present

## 2018-12-24 DIAGNOSIS — E559 Vitamin D deficiency, unspecified: Secondary | ICD-10-CM | POA: Diagnosis not present

## 2018-12-27 DIAGNOSIS — E039 Hypothyroidism, unspecified: Secondary | ICD-10-CM | POA: Diagnosis not present

## 2018-12-27 DIAGNOSIS — I10 Essential (primary) hypertension: Secondary | ICD-10-CM | POA: Diagnosis not present

## 2018-12-27 DIAGNOSIS — R739 Hyperglycemia, unspecified: Secondary | ICD-10-CM | POA: Diagnosis not present

## 2018-12-27 DIAGNOSIS — F419 Anxiety disorder, unspecified: Secondary | ICD-10-CM | POA: Diagnosis not present

## 2018-12-27 DIAGNOSIS — Z23 Encounter for immunization: Secondary | ICD-10-CM | POA: Diagnosis not present

## 2018-12-27 DIAGNOSIS — Z6831 Body mass index (BMI) 31.0-31.9, adult: Secondary | ICD-10-CM | POA: Diagnosis not present

## 2018-12-27 DIAGNOSIS — N189 Chronic kidney disease, unspecified: Secondary | ICD-10-CM | POA: Diagnosis not present

## 2018-12-27 DIAGNOSIS — E782 Mixed hyperlipidemia: Secondary | ICD-10-CM | POA: Diagnosis not present

## 2019-01-17 DIAGNOSIS — M47819 Spondylosis without myelopathy or radiculopathy, site unspecified: Secondary | ICD-10-CM | POA: Diagnosis not present

## 2019-01-17 DIAGNOSIS — K219 Gastro-esophageal reflux disease without esophagitis: Secondary | ICD-10-CM | POA: Diagnosis not present

## 2019-01-17 DIAGNOSIS — Z85820 Personal history of malignant melanoma of skin: Secondary | ICD-10-CM | POA: Diagnosis not present

## 2019-01-17 DIAGNOSIS — Z87891 Personal history of nicotine dependence: Secondary | ICD-10-CM | POA: Diagnosis not present

## 2019-01-17 DIAGNOSIS — F325 Major depressive disorder, single episode, in full remission: Secondary | ICD-10-CM | POA: Diagnosis not present

## 2019-01-17 DIAGNOSIS — E039 Hypothyroidism, unspecified: Secondary | ICD-10-CM | POA: Diagnosis not present

## 2019-01-17 DIAGNOSIS — G4700 Insomnia, unspecified: Secondary | ICD-10-CM | POA: Diagnosis not present

## 2019-01-17 DIAGNOSIS — G43909 Migraine, unspecified, not intractable, without status migrainosus: Secondary | ICD-10-CM | POA: Diagnosis not present

## 2019-01-17 DIAGNOSIS — E785 Hyperlipidemia, unspecified: Secondary | ICD-10-CM | POA: Diagnosis not present

## 2019-01-17 DIAGNOSIS — Z961 Presence of intraocular lens: Secondary | ICD-10-CM | POA: Diagnosis not present

## 2019-01-17 DIAGNOSIS — Z9842 Cataract extraction status, left eye: Secondary | ICD-10-CM | POA: Diagnosis not present

## 2019-01-17 DIAGNOSIS — I129 Hypertensive chronic kidney disease with stage 1 through stage 4 chronic kidney disease, or unspecified chronic kidney disease: Secondary | ICD-10-CM | POA: Diagnosis not present

## 2019-01-17 DIAGNOSIS — J309 Allergic rhinitis, unspecified: Secondary | ICD-10-CM | POA: Diagnosis not present

## 2019-01-17 DIAGNOSIS — Z96652 Presence of left artificial knee joint: Secondary | ICD-10-CM | POA: Diagnosis not present

## 2019-01-17 DIAGNOSIS — Z9841 Cataract extraction status, right eye: Secondary | ICD-10-CM | POA: Diagnosis not present

## 2019-02-20 DIAGNOSIS — E039 Hypothyroidism, unspecified: Secondary | ICD-10-CM | POA: Diagnosis not present

## 2019-02-20 DIAGNOSIS — Z23 Encounter for immunization: Secondary | ICD-10-CM | POA: Diagnosis not present

## 2019-03-31 DIAGNOSIS — E782 Mixed hyperlipidemia: Secondary | ICD-10-CM | POA: Diagnosis not present

## 2019-03-31 DIAGNOSIS — I10 Essential (primary) hypertension: Secondary | ICD-10-CM | POA: Diagnosis not present

## 2019-03-31 DIAGNOSIS — E119 Type 2 diabetes mellitus without complications: Secondary | ICD-10-CM | POA: Diagnosis not present

## 2019-04-16 DIAGNOSIS — M25552 Pain in left hip: Secondary | ICD-10-CM | POA: Diagnosis not present

## 2019-04-23 DIAGNOSIS — N189 Chronic kidney disease, unspecified: Secondary | ICD-10-CM | POA: Diagnosis not present

## 2019-04-23 DIAGNOSIS — E782 Mixed hyperlipidemia: Secondary | ICD-10-CM | POA: Diagnosis not present

## 2019-04-23 DIAGNOSIS — I10 Essential (primary) hypertension: Secondary | ICD-10-CM | POA: Diagnosis not present

## 2019-04-23 DIAGNOSIS — E785 Hyperlipidemia, unspecified: Secondary | ICD-10-CM | POA: Diagnosis not present

## 2019-04-23 DIAGNOSIS — E119 Type 2 diabetes mellitus without complications: Secondary | ICD-10-CM | POA: Diagnosis not present

## 2019-04-23 DIAGNOSIS — K219 Gastro-esophageal reflux disease without esophagitis: Secondary | ICD-10-CM | POA: Diagnosis not present

## 2019-04-23 DIAGNOSIS — E039 Hypothyroidism, unspecified: Secondary | ICD-10-CM | POA: Diagnosis not present

## 2019-04-23 DIAGNOSIS — R5383 Other fatigue: Secondary | ICD-10-CM | POA: Diagnosis not present

## 2019-04-30 DIAGNOSIS — E039 Hypothyroidism, unspecified: Secondary | ICD-10-CM | POA: Diagnosis not present

## 2019-04-30 DIAGNOSIS — N189 Chronic kidney disease, unspecified: Secondary | ICD-10-CM | POA: Diagnosis not present

## 2019-04-30 DIAGNOSIS — E782 Mixed hyperlipidemia: Secondary | ICD-10-CM | POA: Diagnosis not present

## 2019-04-30 DIAGNOSIS — Z23 Encounter for immunization: Secondary | ICD-10-CM | POA: Diagnosis not present

## 2019-04-30 DIAGNOSIS — R739 Hyperglycemia, unspecified: Secondary | ICD-10-CM | POA: Diagnosis not present

## 2019-04-30 DIAGNOSIS — Z6831 Body mass index (BMI) 31.0-31.9, adult: Secondary | ICD-10-CM | POA: Diagnosis not present

## 2019-04-30 DIAGNOSIS — F419 Anxiety disorder, unspecified: Secondary | ICD-10-CM | POA: Diagnosis not present

## 2019-04-30 DIAGNOSIS — I10 Essential (primary) hypertension: Secondary | ICD-10-CM | POA: Diagnosis not present

## 2019-07-31 DIAGNOSIS — M5136 Other intervertebral disc degeneration, lumbar region: Secondary | ICD-10-CM | POA: Diagnosis not present

## 2019-09-22 DIAGNOSIS — H26492 Other secondary cataract, left eye: Secondary | ICD-10-CM | POA: Diagnosis not present

## 2019-09-24 DIAGNOSIS — M25552 Pain in left hip: Secondary | ICD-10-CM | POA: Diagnosis not present

## 2019-09-26 DIAGNOSIS — E7849 Other hyperlipidemia: Secondary | ICD-10-CM | POA: Diagnosis not present

## 2019-09-26 DIAGNOSIS — I10 Essential (primary) hypertension: Secondary | ICD-10-CM | POA: Diagnosis not present

## 2019-10-07 DIAGNOSIS — D485 Neoplasm of uncertain behavior of skin: Secondary | ICD-10-CM | POA: Diagnosis not present

## 2019-10-07 DIAGNOSIS — D235 Other benign neoplasm of skin of trunk: Secondary | ICD-10-CM | POA: Diagnosis not present

## 2019-10-07 DIAGNOSIS — L988 Other specified disorders of the skin and subcutaneous tissue: Secondary | ICD-10-CM | POA: Diagnosis not present

## 2019-10-07 DIAGNOSIS — D225 Melanocytic nevi of trunk: Secondary | ICD-10-CM | POA: Diagnosis not present

## 2019-10-09 DIAGNOSIS — Z20828 Contact with and (suspected) exposure to other viral communicable diseases: Secondary | ICD-10-CM | POA: Diagnosis not present

## 2019-10-09 DIAGNOSIS — K5792 Diverticulitis of intestine, part unspecified, without perforation or abscess without bleeding: Secondary | ICD-10-CM | POA: Diagnosis not present

## 2019-10-09 DIAGNOSIS — Z6830 Body mass index (BMI) 30.0-30.9, adult: Secondary | ICD-10-CM | POA: Diagnosis not present

## 2019-10-16 ENCOUNTER — Telehealth: Payer: Self-pay | Admitting: Physician Assistant

## 2019-10-16 NOTE — Telephone Encounter (Signed)
Patient calling to get pathology results.  Chart #[id number]

## 2019-10-16 NOTE — Telephone Encounter (Signed)
Pathology results to patient.  °

## 2019-10-16 NOTE — Telephone Encounter (Signed)
Left message for patient to call us back for results.

## 2019-11-03 DIAGNOSIS — H26491 Other secondary cataract, right eye: Secondary | ICD-10-CM | POA: Diagnosis not present

## 2019-11-22 DIAGNOSIS — N390 Urinary tract infection, site not specified: Secondary | ICD-10-CM | POA: Diagnosis not present

## 2019-11-28 DIAGNOSIS — E039 Hypothyroidism, unspecified: Secondary | ICD-10-CM | POA: Diagnosis not present

## 2019-11-28 DIAGNOSIS — I10 Essential (primary) hypertension: Secondary | ICD-10-CM | POA: Diagnosis not present

## 2019-12-29 DIAGNOSIS — E785 Hyperlipidemia, unspecified: Secondary | ICD-10-CM | POA: Diagnosis not present

## 2019-12-29 DIAGNOSIS — E039 Hypothyroidism, unspecified: Secondary | ICD-10-CM | POA: Diagnosis not present

## 2019-12-29 DIAGNOSIS — N1832 Chronic kidney disease, stage 3b: Secondary | ICD-10-CM | POA: Diagnosis not present

## 2019-12-29 DIAGNOSIS — I129 Hypertensive chronic kidney disease with stage 1 through stage 4 chronic kidney disease, or unspecified chronic kidney disease: Secondary | ICD-10-CM | POA: Diagnosis not present

## 2020-01-16 DIAGNOSIS — E039 Hypothyroidism, unspecified: Secondary | ICD-10-CM | POA: Diagnosis not present

## 2020-01-16 DIAGNOSIS — R5383 Other fatigue: Secondary | ICD-10-CM | POA: Diagnosis not present

## 2020-01-16 DIAGNOSIS — E785 Hyperlipidemia, unspecified: Secondary | ICD-10-CM | POA: Diagnosis not present

## 2020-01-16 DIAGNOSIS — E7800 Pure hypercholesterolemia, unspecified: Secondary | ICD-10-CM | POA: Diagnosis not present

## 2020-01-16 DIAGNOSIS — E782 Mixed hyperlipidemia: Secondary | ICD-10-CM | POA: Diagnosis not present

## 2020-01-16 DIAGNOSIS — N189 Chronic kidney disease, unspecified: Secondary | ICD-10-CM | POA: Diagnosis not present

## 2020-01-16 DIAGNOSIS — K219 Gastro-esophageal reflux disease without esophagitis: Secondary | ICD-10-CM | POA: Diagnosis not present

## 2020-01-16 DIAGNOSIS — E119 Type 2 diabetes mellitus without complications: Secondary | ICD-10-CM | POA: Diagnosis not present

## 2020-01-16 DIAGNOSIS — I10 Essential (primary) hypertension: Secondary | ICD-10-CM | POA: Diagnosis not present

## 2020-01-20 DIAGNOSIS — E782 Mixed hyperlipidemia: Secondary | ICD-10-CM | POA: Diagnosis not present

## 2020-01-20 DIAGNOSIS — Z1231 Encounter for screening mammogram for malignant neoplasm of breast: Secondary | ICD-10-CM | POA: Diagnosis not present

## 2020-01-20 DIAGNOSIS — I10 Essential (primary) hypertension: Secondary | ICD-10-CM | POA: Diagnosis not present

## 2020-01-20 DIAGNOSIS — R079 Chest pain, unspecified: Secondary | ICD-10-CM | POA: Diagnosis not present

## 2020-01-20 DIAGNOSIS — R739 Hyperglycemia, unspecified: Secondary | ICD-10-CM | POA: Diagnosis not present

## 2020-01-20 DIAGNOSIS — Z1331 Encounter for screening for depression: Secondary | ICD-10-CM | POA: Diagnosis not present

## 2020-01-20 DIAGNOSIS — Z1389 Encounter for screening for other disorder: Secondary | ICD-10-CM | POA: Diagnosis not present

## 2020-01-20 DIAGNOSIS — Z0000 Encounter for general adult medical examination without abnormal findings: Secondary | ICD-10-CM | POA: Diagnosis not present

## 2020-01-20 DIAGNOSIS — Z6829 Body mass index (BMI) 29.0-29.9, adult: Secondary | ICD-10-CM | POA: Diagnosis not present

## 2020-01-28 DIAGNOSIS — N949 Unspecified condition associated with female genital organs and menstrual cycle: Secondary | ICD-10-CM | POA: Diagnosis not present

## 2020-01-28 DIAGNOSIS — N1832 Chronic kidney disease, stage 3b: Secondary | ICD-10-CM | POA: Diagnosis not present

## 2020-01-28 DIAGNOSIS — N952 Postmenopausal atrophic vaginitis: Secondary | ICD-10-CM | POA: Diagnosis not present

## 2020-01-28 DIAGNOSIS — I129 Hypertensive chronic kidney disease with stage 1 through stage 4 chronic kidney disease, or unspecified chronic kidney disease: Secondary | ICD-10-CM | POA: Diagnosis not present

## 2020-01-28 DIAGNOSIS — E785 Hyperlipidemia, unspecified: Secondary | ICD-10-CM | POA: Diagnosis not present

## 2020-01-28 DIAGNOSIS — E039 Hypothyroidism, unspecified: Secondary | ICD-10-CM | POA: Diagnosis not present

## 2020-01-28 DIAGNOSIS — Z124 Encounter for screening for malignant neoplasm of cervix: Secondary | ICD-10-CM | POA: Diagnosis not present

## 2020-02-17 DIAGNOSIS — N949 Unspecified condition associated with female genital organs and menstrual cycle: Secondary | ICD-10-CM | POA: Diagnosis not present

## 2020-02-27 DIAGNOSIS — N1832 Chronic kidney disease, stage 3b: Secondary | ICD-10-CM | POA: Diagnosis not present

## 2020-02-27 DIAGNOSIS — I129 Hypertensive chronic kidney disease with stage 1 through stage 4 chronic kidney disease, or unspecified chronic kidney disease: Secondary | ICD-10-CM | POA: Diagnosis not present

## 2020-02-27 DIAGNOSIS — E785 Hyperlipidemia, unspecified: Secondary | ICD-10-CM | POA: Diagnosis not present

## 2020-02-27 DIAGNOSIS — E039 Hypothyroidism, unspecified: Secondary | ICD-10-CM | POA: Diagnosis not present

## 2020-03-30 DIAGNOSIS — N1832 Chronic kidney disease, stage 3b: Secondary | ICD-10-CM | POA: Diagnosis not present

## 2020-03-30 DIAGNOSIS — I129 Hypertensive chronic kidney disease with stage 1 through stage 4 chronic kidney disease, or unspecified chronic kidney disease: Secondary | ICD-10-CM | POA: Diagnosis not present

## 2020-03-30 DIAGNOSIS — E785 Hyperlipidemia, unspecified: Secondary | ICD-10-CM | POA: Diagnosis not present

## 2020-03-30 DIAGNOSIS — E039 Hypothyroidism, unspecified: Secondary | ICD-10-CM | POA: Diagnosis not present

## 2020-04-15 DIAGNOSIS — N390 Urinary tract infection, site not specified: Secondary | ICD-10-CM | POA: Diagnosis not present

## 2020-04-29 DIAGNOSIS — I129 Hypertensive chronic kidney disease with stage 1 through stage 4 chronic kidney disease, or unspecified chronic kidney disease: Secondary | ICD-10-CM | POA: Diagnosis not present

## 2020-04-29 DIAGNOSIS — N1832 Chronic kidney disease, stage 3b: Secondary | ICD-10-CM | POA: Diagnosis not present

## 2020-04-29 DIAGNOSIS — E039 Hypothyroidism, unspecified: Secondary | ICD-10-CM | POA: Diagnosis not present

## 2020-04-30 DIAGNOSIS — I129 Hypertensive chronic kidney disease with stage 1 through stage 4 chronic kidney disease, or unspecified chronic kidney disease: Secondary | ICD-10-CM | POA: Diagnosis not present

## 2020-04-30 DIAGNOSIS — N1832 Chronic kidney disease, stage 3b: Secondary | ICD-10-CM | POA: Diagnosis not present

## 2020-04-30 DIAGNOSIS — E039 Hypothyroidism, unspecified: Secondary | ICD-10-CM | POA: Diagnosis not present

## 2020-05-29 DIAGNOSIS — I129 Hypertensive chronic kidney disease with stage 1 through stage 4 chronic kidney disease, or unspecified chronic kidney disease: Secondary | ICD-10-CM | POA: Diagnosis not present

## 2020-05-29 DIAGNOSIS — N1832 Chronic kidney disease, stage 3b: Secondary | ICD-10-CM | POA: Diagnosis not present

## 2020-05-29 DIAGNOSIS — E039 Hypothyroidism, unspecified: Secondary | ICD-10-CM | POA: Diagnosis not present

## 2020-05-31 DIAGNOSIS — K219 Gastro-esophageal reflux disease without esophagitis: Secondary | ICD-10-CM | POA: Diagnosis not present

## 2020-05-31 DIAGNOSIS — E785 Hyperlipidemia, unspecified: Secondary | ICD-10-CM | POA: Diagnosis not present

## 2020-05-31 DIAGNOSIS — E119 Type 2 diabetes mellitus without complications: Secondary | ICD-10-CM | POA: Diagnosis not present

## 2020-05-31 DIAGNOSIS — E782 Mixed hyperlipidemia: Secondary | ICD-10-CM | POA: Diagnosis not present

## 2020-05-31 DIAGNOSIS — I10 Essential (primary) hypertension: Secondary | ICD-10-CM | POA: Diagnosis not present

## 2020-05-31 DIAGNOSIS — N189 Chronic kidney disease, unspecified: Secondary | ICD-10-CM | POA: Diagnosis not present

## 2020-06-04 DIAGNOSIS — I10 Essential (primary) hypertension: Secondary | ICD-10-CM | POA: Diagnosis not present

## 2020-06-04 DIAGNOSIS — E039 Hypothyroidism, unspecified: Secondary | ICD-10-CM | POA: Diagnosis not present

## 2020-06-04 DIAGNOSIS — Z6829 Body mass index (BMI) 29.0-29.9, adult: Secondary | ICD-10-CM | POA: Diagnosis not present

## 2020-06-04 DIAGNOSIS — E782 Mixed hyperlipidemia: Secondary | ICD-10-CM | POA: Diagnosis not present

## 2020-06-04 DIAGNOSIS — R739 Hyperglycemia, unspecified: Secondary | ICD-10-CM | POA: Diagnosis not present

## 2020-06-04 DIAGNOSIS — R102 Pelvic and perineal pain: Secondary | ICD-10-CM | POA: Diagnosis not present

## 2020-06-04 DIAGNOSIS — R079 Chest pain, unspecified: Secondary | ICD-10-CM | POA: Diagnosis not present

## 2020-06-04 DIAGNOSIS — N189 Chronic kidney disease, unspecified: Secondary | ICD-10-CM | POA: Diagnosis not present

## 2020-06-14 DIAGNOSIS — D259 Leiomyoma of uterus, unspecified: Secondary | ICD-10-CM | POA: Diagnosis not present

## 2020-06-14 DIAGNOSIS — N83201 Unspecified ovarian cyst, right side: Secondary | ICD-10-CM | POA: Diagnosis not present

## 2020-06-17 DIAGNOSIS — Z961 Presence of intraocular lens: Secondary | ICD-10-CM | POA: Diagnosis not present

## 2020-07-07 DIAGNOSIS — M25552 Pain in left hip: Secondary | ICD-10-CM | POA: Diagnosis not present

## 2020-07-30 DIAGNOSIS — E039 Hypothyroidism, unspecified: Secondary | ICD-10-CM | POA: Diagnosis not present

## 2020-07-30 DIAGNOSIS — N1832 Chronic kidney disease, stage 3b: Secondary | ICD-10-CM | POA: Diagnosis not present

## 2020-07-30 DIAGNOSIS — I129 Hypertensive chronic kidney disease with stage 1 through stage 4 chronic kidney disease, or unspecified chronic kidney disease: Secondary | ICD-10-CM | POA: Diagnosis not present

## 2020-08-14 DIAGNOSIS — M5416 Radiculopathy, lumbar region: Secondary | ICD-10-CM | POA: Diagnosis not present

## 2020-08-28 DIAGNOSIS — I129 Hypertensive chronic kidney disease with stage 1 through stage 4 chronic kidney disease, or unspecified chronic kidney disease: Secondary | ICD-10-CM | POA: Diagnosis not present

## 2020-08-28 DIAGNOSIS — E785 Hyperlipidemia, unspecified: Secondary | ICD-10-CM | POA: Diagnosis not present

## 2020-08-28 DIAGNOSIS — N1832 Chronic kidney disease, stage 3b: Secondary | ICD-10-CM | POA: Diagnosis not present

## 2020-08-28 DIAGNOSIS — E039 Hypothyroidism, unspecified: Secondary | ICD-10-CM | POA: Diagnosis not present

## 2020-09-27 DIAGNOSIS — E785 Hyperlipidemia, unspecified: Secondary | ICD-10-CM | POA: Diagnosis not present

## 2020-09-27 DIAGNOSIS — I129 Hypertensive chronic kidney disease with stage 1 through stage 4 chronic kidney disease, or unspecified chronic kidney disease: Secondary | ICD-10-CM | POA: Diagnosis not present

## 2020-09-27 DIAGNOSIS — N1832 Chronic kidney disease, stage 3b: Secondary | ICD-10-CM | POA: Diagnosis not present

## 2020-09-27 DIAGNOSIS — E039 Hypothyroidism, unspecified: Secondary | ICD-10-CM | POA: Diagnosis not present

## 2020-10-20 DIAGNOSIS — E119 Type 2 diabetes mellitus without complications: Secondary | ICD-10-CM | POA: Diagnosis not present

## 2020-10-20 DIAGNOSIS — E782 Mixed hyperlipidemia: Secondary | ICD-10-CM | POA: Diagnosis not present

## 2020-10-20 DIAGNOSIS — E7849 Other hyperlipidemia: Secondary | ICD-10-CM | POA: Diagnosis not present

## 2020-10-20 DIAGNOSIS — E539 Vitamin B deficiency, unspecified: Secondary | ICD-10-CM | POA: Diagnosis not present

## 2020-10-20 DIAGNOSIS — E7800 Pure hypercholesterolemia, unspecified: Secondary | ICD-10-CM | POA: Diagnosis not present

## 2020-10-20 DIAGNOSIS — I10 Essential (primary) hypertension: Secondary | ICD-10-CM | POA: Diagnosis not present

## 2020-10-20 DIAGNOSIS — N189 Chronic kidney disease, unspecified: Secondary | ICD-10-CM | POA: Diagnosis not present

## 2020-10-20 DIAGNOSIS — E559 Vitamin D deficiency, unspecified: Secondary | ICD-10-CM | POA: Diagnosis not present

## 2020-10-20 DIAGNOSIS — R5383 Other fatigue: Secondary | ICD-10-CM | POA: Diagnosis not present

## 2020-10-20 DIAGNOSIS — E7801 Familial hypercholesterolemia: Secondary | ICD-10-CM | POA: Diagnosis not present

## 2020-10-22 DIAGNOSIS — E7849 Other hyperlipidemia: Secondary | ICD-10-CM | POA: Diagnosis not present

## 2020-10-22 DIAGNOSIS — K219 Gastro-esophageal reflux disease without esophagitis: Secondary | ICD-10-CM | POA: Diagnosis not present

## 2020-10-22 DIAGNOSIS — R739 Hyperglycemia, unspecified: Secondary | ICD-10-CM | POA: Diagnosis not present

## 2020-10-22 DIAGNOSIS — F419 Anxiety disorder, unspecified: Secondary | ICD-10-CM | POA: Diagnosis not present

## 2020-10-22 DIAGNOSIS — I10 Essential (primary) hypertension: Secondary | ICD-10-CM | POA: Diagnosis not present

## 2020-10-22 DIAGNOSIS — M2550 Pain in unspecified joint: Secondary | ICD-10-CM | POA: Diagnosis not present

## 2020-10-22 DIAGNOSIS — N189 Chronic kidney disease, unspecified: Secondary | ICD-10-CM | POA: Diagnosis not present

## 2020-10-22 DIAGNOSIS — E039 Hypothyroidism, unspecified: Secondary | ICD-10-CM | POA: Diagnosis not present

## 2020-10-27 DIAGNOSIS — I129 Hypertensive chronic kidney disease with stage 1 through stage 4 chronic kidney disease, or unspecified chronic kidney disease: Secondary | ICD-10-CM | POA: Diagnosis not present

## 2020-10-27 DIAGNOSIS — N1832 Chronic kidney disease, stage 3b: Secondary | ICD-10-CM | POA: Diagnosis not present

## 2020-10-27 DIAGNOSIS — E785 Hyperlipidemia, unspecified: Secondary | ICD-10-CM | POA: Diagnosis not present

## 2020-10-27 DIAGNOSIS — E039 Hypothyroidism, unspecified: Secondary | ICD-10-CM | POA: Diagnosis not present

## 2020-11-04 ENCOUNTER — Other Ambulatory Visit: Payer: Self-pay

## 2020-11-04 ENCOUNTER — Encounter: Payer: Self-pay | Admitting: Physician Assistant

## 2020-11-04 ENCOUNTER — Ambulatory Visit: Payer: Medicare PPO | Admitting: Physician Assistant

## 2020-11-04 DIAGNOSIS — L821 Other seborrheic keratosis: Secondary | ICD-10-CM

## 2020-11-04 DIAGNOSIS — L239 Allergic contact dermatitis, unspecified cause: Secondary | ICD-10-CM | POA: Diagnosis not present

## 2020-11-04 DIAGNOSIS — D1800 Hemangioma unspecified site: Secondary | ICD-10-CM

## 2020-11-04 DIAGNOSIS — L578 Other skin changes due to chronic exposure to nonionizing radiation: Secondary | ICD-10-CM

## 2020-11-04 DIAGNOSIS — L814 Other melanin hyperpigmentation: Secondary | ICD-10-CM

## 2020-11-04 DIAGNOSIS — Z1283 Encounter for screening for malignant neoplasm of skin: Secondary | ICD-10-CM | POA: Diagnosis not present

## 2020-11-04 DIAGNOSIS — Z85820 Personal history of malignant melanoma of skin: Secondary | ICD-10-CM

## 2020-11-04 MED ORDER — TRIAMCINOLONE ACETONIDE 0.1 % EX CREA: TOPICAL_CREAM | CUTANEOUS | 1 refills | Status: DC

## 2020-11-04 NOTE — Progress Notes (Signed)
   Follow-Up Visit   Subjective  Penny Morgan is a 75 y.o. female who presents for the following: Annual Exam (Patient here today for yearly skin check no concerns. Patient does have a personal history of MM on her back and personal history of atypical moles.).   The following portions of the chart were reviewed this encounter and updated as appropriate:  Tobacco  Allergies  Meds  Problems  Med Hx  Surg Hx  Fam Hx      Objective  Well appearing patient in no apparent distress; mood and affect are within normal limits.  A full examination was performed including scalp, head, eyes, ears, nose, lips, neck, chest, axillae, abdomen, back, buttocks, bilateral upper extremities, bilateral lower extremities, hands, feet, fingers, toes, fingernails, and toenails. All findings within normal limits unless otherwise noted below. No LAD  Objective  Mid Back: Dyspigmented scar.    Assessment & Plan  Personal history of malignant melanoma of skin Mid Back  Yearly skin check  Lentigines - Scattered tan macules - Discussed due to sun exposure - Benign, observe - Call for any changes  Seborrheic Keratoses - Stuck-on, waxy, tan-brown papules and plaques  - Discussed benign etiology and prognosis. - Observe - Call for any changes   Hemangiomas - Red papules - Discussed benign nature - Observe - Call for any changes  Actinic Damage - diffuse scaly erythematous macules with underlying dyspigmentation - Recommend daily broad spectrum sunscreen SPF 30+ to sun-exposed areas, reapply every 2 hours as needed.  - Call for new or changing lesions.  Skin cancer screening performed today.   I, Sidonie Dexheimer, PA-C, have reviewed all documentation's for this visit.  The documentation on 11/04/20 for the exam, diagnosis, procedures and orders are all accurate and complete.

## 2020-11-27 DIAGNOSIS — E785 Hyperlipidemia, unspecified: Secondary | ICD-10-CM | POA: Diagnosis not present

## 2020-11-27 DIAGNOSIS — I129 Hypertensive chronic kidney disease with stage 1 through stage 4 chronic kidney disease, or unspecified chronic kidney disease: Secondary | ICD-10-CM | POA: Diagnosis not present

## 2020-11-27 DIAGNOSIS — N1832 Chronic kidney disease, stage 3b: Secondary | ICD-10-CM | POA: Diagnosis not present

## 2020-11-27 DIAGNOSIS — E039 Hypothyroidism, unspecified: Secondary | ICD-10-CM | POA: Diagnosis not present

## 2020-12-08 DIAGNOSIS — M48062 Spinal stenosis, lumbar region with neurogenic claudication: Secondary | ICD-10-CM | POA: Diagnosis not present

## 2020-12-08 DIAGNOSIS — M5459 Other low back pain: Secondary | ICD-10-CM | POA: Diagnosis not present

## 2020-12-27 DIAGNOSIS — E039 Hypothyroidism, unspecified: Secondary | ICD-10-CM | POA: Diagnosis not present

## 2020-12-27 DIAGNOSIS — N1832 Chronic kidney disease, stage 3b: Secondary | ICD-10-CM | POA: Diagnosis not present

## 2020-12-27 DIAGNOSIS — E785 Hyperlipidemia, unspecified: Secondary | ICD-10-CM | POA: Diagnosis not present

## 2020-12-27 DIAGNOSIS — I129 Hypertensive chronic kidney disease with stage 1 through stage 4 chronic kidney disease, or unspecified chronic kidney disease: Secondary | ICD-10-CM | POA: Diagnosis not present

## 2020-12-28 DIAGNOSIS — M79604 Pain in right leg: Secondary | ICD-10-CM | POA: Diagnosis not present

## 2020-12-28 DIAGNOSIS — M79605 Pain in left leg: Secondary | ICD-10-CM | POA: Diagnosis not present

## 2021-01-20 DIAGNOSIS — M545 Low back pain, unspecified: Secondary | ICD-10-CM | POA: Diagnosis not present

## 2021-01-27 DIAGNOSIS — I129 Hypertensive chronic kidney disease with stage 1 through stage 4 chronic kidney disease, or unspecified chronic kidney disease: Secondary | ICD-10-CM | POA: Diagnosis not present

## 2021-01-27 DIAGNOSIS — N1832 Chronic kidney disease, stage 3b: Secondary | ICD-10-CM | POA: Diagnosis not present

## 2021-01-27 DIAGNOSIS — E785 Hyperlipidemia, unspecified: Secondary | ICD-10-CM | POA: Diagnosis not present

## 2021-01-27 DIAGNOSIS — E039 Hypothyroidism, unspecified: Secondary | ICD-10-CM | POA: Diagnosis not present

## 2021-01-28 DIAGNOSIS — M545 Low back pain, unspecified: Secondary | ICD-10-CM | POA: Diagnosis not present

## 2021-02-19 DIAGNOSIS — R69 Illness, unspecified: Secondary | ICD-10-CM | POA: Diagnosis not present

## 2021-02-28 DIAGNOSIS — M4310 Spondylolisthesis, site unspecified: Secondary | ICD-10-CM | POA: Diagnosis not present

## 2021-02-28 DIAGNOSIS — M48062 Spinal stenosis, lumbar region with neurogenic claudication: Secondary | ICD-10-CM | POA: Diagnosis not present

## 2021-03-01 DIAGNOSIS — E039 Hypothyroidism, unspecified: Secondary | ICD-10-CM | POA: Diagnosis not present

## 2021-03-01 DIAGNOSIS — R300 Dysuria: Secondary | ICD-10-CM | POA: Diagnosis not present

## 2021-03-01 DIAGNOSIS — E7849 Other hyperlipidemia: Secondary | ICD-10-CM | POA: Diagnosis not present

## 2021-03-01 DIAGNOSIS — E119 Type 2 diabetes mellitus without complications: Secondary | ICD-10-CM | POA: Diagnosis not present

## 2021-03-01 DIAGNOSIS — K219 Gastro-esophageal reflux disease without esophagitis: Secondary | ICD-10-CM | POA: Diagnosis not present

## 2021-03-01 DIAGNOSIS — I10 Essential (primary) hypertension: Secondary | ICD-10-CM | POA: Diagnosis not present

## 2021-03-01 DIAGNOSIS — E782 Mixed hyperlipidemia: Secondary | ICD-10-CM | POA: Diagnosis not present

## 2021-03-01 DIAGNOSIS — R5383 Other fatigue: Secondary | ICD-10-CM | POA: Diagnosis not present

## 2021-03-01 DIAGNOSIS — N189 Chronic kidney disease, unspecified: Secondary | ICD-10-CM | POA: Diagnosis not present

## 2021-03-02 DIAGNOSIS — E039 Hypothyroidism, unspecified: Secondary | ICD-10-CM | POA: Diagnosis not present

## 2021-03-02 DIAGNOSIS — R739 Hyperglycemia, unspecified: Secondary | ICD-10-CM | POA: Diagnosis not present

## 2021-03-02 DIAGNOSIS — G4700 Insomnia, unspecified: Secondary | ICD-10-CM | POA: Diagnosis not present

## 2021-03-02 DIAGNOSIS — I10 Essential (primary) hypertension: Secondary | ICD-10-CM | POA: Diagnosis not present

## 2021-03-02 DIAGNOSIS — K219 Gastro-esophageal reflux disease without esophagitis: Secondary | ICD-10-CM | POA: Diagnosis not present

## 2021-03-02 DIAGNOSIS — N189 Chronic kidney disease, unspecified: Secondary | ICD-10-CM | POA: Diagnosis not present

## 2021-03-02 DIAGNOSIS — M2550 Pain in unspecified joint: Secondary | ICD-10-CM | POA: Diagnosis not present

## 2021-03-02 DIAGNOSIS — E7849 Other hyperlipidemia: Secondary | ICD-10-CM | POA: Diagnosis not present

## 2021-03-22 DIAGNOSIS — Z20828 Contact with and (suspected) exposure to other viral communicable diseases: Secondary | ICD-10-CM | POA: Diagnosis not present

## 2021-03-22 DIAGNOSIS — R059 Cough, unspecified: Secondary | ICD-10-CM | POA: Diagnosis not present

## 2021-03-22 DIAGNOSIS — Z6829 Body mass index (BMI) 29.0-29.9, adult: Secondary | ICD-10-CM | POA: Diagnosis not present

## 2021-03-22 DIAGNOSIS — R300 Dysuria: Secondary | ICD-10-CM | POA: Diagnosis not present

## 2021-03-25 ENCOUNTER — Ambulatory Visit: Payer: Self-pay | Admitting: Orthopedic Surgery

## 2021-03-25 NOTE — Pre-Procedure Instructions (Signed)
Surgical Instructions    Your procedure is scheduled on Wednesday, September 7th.  Report to [facility] Main Entrance "A" at 12:30 P.M., then check in with the Admitting office.  Call this number if you have problems the morning of surgery:  [phone]   If you have any questions prior to your surgery date call [phone]: Open Monday-Friday 8am-4pm.    Remember:  Do not eat after midnight the night before your surgery.  You may drink clear liquids until 11:30 AM the morning of your surgery.   Clear liquids allowed are: Water, Non-Citrus Juices (without pulp), Carbonated Beverages, Clear Tea, Black Coffee ONLY (NO MILK, CREAM OR POWDERED CREAMER of any kind), and Gatorade.    Take these medicines the morning of surgery with A SIP OF WATER: montelukast (SINGULAIR)  omeprazole (PRILOSEC) predniSONE (DELTASONE) rosuvastatin (CRESTOR) venlafaxine XR (EFFEXOR-XR)   IF NEEDED: LORazepam (ATIVAN)  rizatriptan (MAXALT-MLT) traMADol (ULTRAM) albuterol (VENTOLIN HFA) inhaler- Please bring with you the day of surgery.    7 days prior to surgery STOP taking any Aspirin (unless otherwise instructed by your surgeon), Aleve, Naproxen, Ibuprofen, Motrin, Advil, Goody's, BC's, all herbal medications, fish oil, and all vitamins.   Do not wear jewelry or makeup Do not wear lotions, powders, colognes, or deodorant. Do not shave 48 hours prior to surgery.  Do not bring valuables to the hospital. DO Not wear nail polish, gel polish, artificial nails, or any other type of covering on natural nails including finger and toenails. If patients have artificial nails, gel coating, etc. that need to be removed by a nail salon please have this removed prior to surgery or surgery may need to be canceled/delayed if the surgeon/ anesthesia feels like the patient is unable to be adequately monitored.             [facility] is not responsible for any belongings or valuables.  Do NOT Smoke  (Tobacco/Vaping) or drink Alcohol 24 hours prior to your procedure. If you use a CPAP at night, you may bring all equipment for your overnight stay.   Contacts, glasses, dentures or bridgework may not be worn into surgery, please bring cases for these belongings.   For patients admitted to the hospital, discharge time will be determined by your treatment team.   Patients discharged the day of surgery will not be allowed to drive home, and someone needs to stay with them for 24 hours.  ONLY 1 SUPPORT PERSON MAY BE PRESENT WHILE YOU ARE IN SURGERY. IF YOU ARE TO BE ADMITTED ONCE YOU ARE IN YOUR ROOM YOU WILL BE ALLOWED TWO (2) VISITORS.  Minor children may have two parents present. Special consideration for safety and communication needs will be reviewed on a case by case basis.  Special instructions:    Oral Hygiene is also important to reduce your risk of infection.  Remember - BRUSH YOUR TEETH THE MORNING OF SURGERY WITH YOUR REGULAR TOOTHPASTE   [facility]- Preparing For Surgery  Before surgery, you can play an important role. Because skin is not sterile, your skin needs to be as free of germs as possible. You can reduce the number of germs on your skin by washing with CHG (chlorahexidine gluconate) Soap before surgery.  CHG is an antiseptic cleaner which kills germs and bonds with the skin to continue killing germs even after washing.     Please do not use if you have an allergy to CHG or antibacterial soaps. If your skin becomes reddened/irritated stop using  the CHG.  Do not shave (including legs and underarms) for at least 48 hours prior to first CHG shower. It is OK to shave your face.  Please follow these instructions carefully.     Shower the NIGHT BEFORE SURGERY and the MORNING OF SURGERY with CHG Soap.   If you chose to wash your hair, wash your hair first as usual with your normal shampoo. After you shampoo, rinse your hair and body thoroughly to remove the shampoo.  Then [employer] and genitals (private parts) with your normal soap and rinse thoroughly to remove soap.  After that Use CHG Soap as you would any other liquid soap. You can apply CHG directly to the skin and wash gently with a scrungie or a clean washcloth.   Apply the CHG Soap to your body ONLY FROM THE NECK DOWN.  Do not use on open wounds or open sores. Avoid contact with your eyes, ears, mouth and genitals (private parts). Wash Face and genitals (private parts)  with your normal soap.   Wash thoroughly, paying special attention to the area where your surgery will be performed.  Thoroughly rinse your body with warm water from the neck down.  DO NOT shower/wash with your normal soap after using and rinsing off the CHG Soap.  Pat yourself dry with a CLEAN TOWEL.  Wear CLEAN PAJAMAS to bed the night before surgery  Place CLEAN SHEETS on your bed the night before your surgery  DO NOT SLEEP WITH PETS.   Day of Surgery:  Take a shower with CHG soap. Wear Clean/Comfortable clothing the morning of surgery Do not apply any deodorants/lotions.   Remember to brush your teeth WITH YOUR REGULAR TOOTHPASTE.   Please read over the following fact sheets that you were given.

## 2021-03-25 NOTE — Progress Notes (Signed)
[facility]'d [facility] Other w/ Dr. Rolena Infante for procedure orders.

## 2021-03-28 ENCOUNTER — Encounter (HOSPITAL_COMMUNITY)
Admission: RE | Admit: 2021-03-28 | Discharge: 2021-03-28 | Disposition: A | Discharge status: Home / Self Care | Payer: Medicare PPO | Source: Ambulatory Visit | Attending: Orthopedic Surgery | Admitting: Orthopedic Surgery

## 2021-03-28 ENCOUNTER — Ambulatory Visit: Payer: Self-pay | Admitting: Orthopedic Surgery

## 2021-03-28 ENCOUNTER — Encounter (HOSPITAL_COMMUNITY): Payer: Self-pay

## 2021-03-28 ENCOUNTER — Other Ambulatory Visit: Payer: Self-pay

## 2021-03-28 DIAGNOSIS — M5417 Radiculopathy, lumbosacral region: Secondary | ICD-10-CM | POA: Diagnosis not present

## 2021-03-28 DIAGNOSIS — Z01818 Encounter for other preprocedural examination: Secondary | ICD-10-CM | POA: Insufficient documentation

## 2021-03-28 LAB — PROTIME-INR
INR: 0.9 (ref 0.8–1.2)
Prothrombin Time: 12.4 seconds (ref 11.4–15.2)

## 2021-03-28 LAB — BASIC METABOLIC PANEL
Anion gap: 6 (ref 5–15)
BUN: 16 mg/dL (ref 8–23)
CO2: 29 mmol/L (ref 22–32)
Calcium: 9 mg/dL (ref 8.9–10.3)
Chloride: 103 mmol/L (ref 98–111)
Creatinine, Ser: 1.17 mg/dL — ABNORMAL HIGH (ref 0.44–1.00)
GFR, Estimated: 49 mL/min — ABNORMAL LOW (ref >60)
Glucose, Bld: 111 mg/dL — ABNORMAL HIGH (ref 70–99)
Potassium: 3.9 mmol/L (ref 3.5–5.1)
Sodium: 138 mmol/L (ref 135–145)

## 2021-03-28 LAB — APTT: aPTT: 25 seconds (ref 24–36)

## 2021-03-28 LAB — URINALYSIS, ROUTINE W REFLEX MICROSCOPIC
Bacteria, UA: NONE SEEN
Bilirubin Urine: NEGATIVE
Glucose, UA: NEGATIVE mg/dL
Hgb urine dipstick: NEGATIVE
Ketones, ur: 5 mg/dL — AB
Nitrite: NEGATIVE
Protein, ur: NEGATIVE mg/dL
Specific Gravity, Urine: 1.023 (ref 1.005–1.030)
pH: 5 (ref 5.0–8.0)

## 2021-03-28 LAB — TYPE AND SCREEN
ABO/RH(D): A POS
Antibody Screen: NEGATIVE

## 2021-03-28 LAB — CBC
HCT: 43.4 % (ref 36.0–46.0)
Hemoglobin: 13.8 g/dL (ref 12.0–15.0)
MCH: 29.1 pg (ref 26.0–34.0)
MCHC: 31.8 g/dL (ref 30.0–36.0)
MCV: 91.4 fL (ref 80.0–100.0)
Platelets: 370 10*3/uL (ref 150–400)
RBC: 4.75 MIL/uL (ref 3.87–5.11)
RDW: 13.5 % (ref 11.5–15.5)
WBC: 10 10*3/uL (ref 4.0–10.5)
nRBC: 0 % (ref 0.0–0.2)

## 2021-03-28 LAB — SURGICAL PCR SCREEN
MRSA, PCR: NEGATIVE
Staphylococcus aureus: NEGATIVE

## 2021-03-28 NOTE — Progress Notes (Signed)
Pt's UA positive for leukocytes (small amt). Pt explained in PAT appt that she is taking antibiotics for a bladder infection.

## 2021-03-28 NOTE — Progress Notes (Signed)
PCP - Kassie Mends, PA-C Cardiologist - denies  PPM/ICD - denies   Chest x-ray - 07/29/12 EKG - 03/28/21 at PAT appt Stress Test - pt states "years ago" and no abnormalities per pt ECHO - pt is unsure, records requested from PCP Cardiac Cath - denies  Sleep Study - pt states she had one 20 years ago, no diagnosis of sleep apnea   DM: denies  Aspirin Instructions: pt instructed to stop taking aspirin one week prior. Pt states she has stopped taking it.   ERAS Protcol - yes, no drink   COVID TEST- Pt instructed to go to testing center on 9/5.   Anesthesia review: No  Patient denies shortness of breath, fever, cough and chest pain at PAT appointment   All instructions explained to the patient, with a verbal understanding of the material. Patient agrees to go over the instructions while at home for a better understanding. The opportunity to ask questions was provided.

## 2021-03-28 NOTE — H&P (Deleted)
  The note originally documented on this encounter has been moved the the encounter in which it belongs.  

## 2021-03-28 NOTE — H&P (Signed)
Subjective:   Penny Morgan is a very pleasant active 75 year old who has had debilitating back buttock and radicular leg pain for some time now. Despite injection therapy, physical therapy, anti-inflammatory medications her quality-of-life his continue to worsen. We discussed risks, benefits of surgical intervention as well as alternative treatments and the patient has expressed a desire to move forward with surgery. She is scheduled for TLIF L4-5 At [facility] With Dr. Rolena Infante on 04/06/21  Patient Active Problem List   Diagnosis Date Noted   Positive colorectal cancer screening using Cologuard test    Diverticulosis of large intestine without diverticulitis    Hemorrhoids without complication    Expected blood loss anemia 08/06/2012   Obese 08/06/2012   S/P left UKA 08/05/2012   Past Medical History:  Diagnosis Date   Anxiety    Arthritis    Atypical nevus 09/14/1998   right mid back - mild, Left side-slight   Atypical nevus 06/06/2002   left flank-slight/moderate RE CK 8MO   Atypical nevus 12/04/2002   right abdomen-moderate TX W/S   Atypical nevus 01/12/2011   left breast moderate/severe TX W/S   Atypical nevus 07/03/2014   rt side-mild, left thigh-mild   Atypical nevus 09/11/2017   Left shin-atypial junction MOHS EXC   Atypical nevus 01/12/2011   lower right side (moderate)   Atypical nevus 01/12/2011   rigth lower back (mod/severe) tx w/s   Carpal tunnel syndrome    Chest congestion    CHRONIC   Chronic kidney disease    STAGE III KIDNEY DISEASE   [place] level II melanoma ([place]) 06/22/2010   mid upper back REF. DR Clovis Riley   Contact lens/glasses fitting    contacts or glasses   Depression    Difficulty sleeping    GERD (gastroesophageal reflux disease)    Headache(784.0)    MIGRAINES   Hypertension    TAKES BP MED "FOR KIDNEYS" HAS HAD ELEVATED BP IN [place] PAST   Hypothyroidism 1977   pt states she had to have radioactive iodine   Melanoma ([place]) 2 YRS AGO    Past Surgical  History:  Procedure Laterality Date   CARPAL TUNNEL RELEASE Right 12/05/2012   Procedure: CARPAL TUNNEL RELEASE;  Surgeon: Cammie Sickle., MD;  Location: [facility];  Service: Orthopedics;  Laterality: Right;   CARPAL TUNNEL RELEASE Left 10/21/2013   Procedure: CARPAL TUNNEL RELEASE;  Surgeon: Cammie Sickle., MD;  Location: [facility];  Service: Orthopedics;  Laterality: Left;   CARPOMETACARPEL SUSPENSION PLASTY Left 10/21/2013   Procedure: TRAPEZIECTOMY FLEXOR CARPI RADIALIS KNOT PLASTY;  Surgeon: Cammie Sickle., MD;  Location: [facility];  Service: Orthopedics;  Laterality: Left;   COLONOSCOPY N/A 10/15/2018   Procedure: COLONOSCOPY;  Surgeon: Aviva Signs, MD;  [facility];  Service: Gastroenterology;  Laterality: N/A;   EYE SURGERY Bilateral 2018   pt states she had this done in [place]  07/31/2009   back   PARTIAL KNEE ARTHROPLASTY  08/05/2012   Procedure: UNICOMPARTMENTAL KNEE;  Surgeon: Mauri Pole, MD;  Location: [facility];  Service: Orthopedics;  Laterality: Left;   TONSILLECTOMY     TRIGGER FINGER RELEASE     L HAND   TRIGGER FINGER RELEASE Right 12/05/2012   Procedure: RELEASE TRIGGER FINGER/A-1 PULLEY RIGHT RING FINGER;  Surgeon: Cammie Sickle., MD;  Location: [facility];  Service: Orthopedics;  Laterality: Right;   TRIGGER FINGER RELEASE Left 10/21/2013   Procedure: LEFT RING  STENOSING TENOSYNOVITIS RELEASE AND  LEFT THUMB;  Surgeon: Cammie Sickle., MD;  Location: [facility];  Service: Orthopedics;  Laterality: Left;    Current Outpatient Medications  Medication Sig Dispense Refill Last Dose   albuterol (VENTOLIN HFA) 108 (90 Base) MCG/ACT inhaler Inhale 2 puffs into the lungs every 6 (six) hours as needed for wheezing or shortness of breath.      aspirin EC 81 MG tablet Take 81 mg by mouth daily. Swallow whole.       cefPROZIL (CEFZIL) 250 MG tablet Take 250 mg by mouth 2 (two) times daily.      furosemide (LASIX) 20 MG tablet Take 20 mg by mouth daily as needed for fluid.       levothyroxine (SYNTHROID) 100 MCG tablet Take 100 mcg by mouth every evening.      LORazepam (ATIVAN) 0.5 MG tablet Take 0.25 mg by mouth 2 (two) times daily as needed for anxiety.      losartan (COZAAR) 50 MG tablet Take 50 mg by mouth daily.      montelukast (SINGULAIR) 10 MG tablet Take 10 mg by mouth daily.       Multiple Vitamin (MULTIVITAMIN WITH MINERALS) TABS Take 1 tablet by mouth daily.      omeprazole (PRILOSEC) 20 MG capsule Take 20 mg by mouth daily.      Potassium 99 MG TABS Take 99 mg by mouth daily.      predniSONE (DELTASONE) 20 MG tablet Take 40 mg by mouth daily with breakfast.      rizatriptan (MAXALT-MLT) 10 MG disintegrating tablet Take 10 mg by mouth every 2 (two) hours as needed for migraine (2 doses/24 hrs.). May repeat in 2 hours if needed for migraine      rosuvastatin (CRESTOR) 5 MG tablet Take 5 mg by mouth 2 (two) times a week.      traMADol (ULTRAM) 50 MG tablet Take 50 mg by mouth every 6 (six) hours as needed (pain).       triamcinolone (KENALOG) 0.1 % Once to twice daily not for face or skin folds. (Patient not taking: Reported on 03/23/2021) 453.6 g 1    venlafaxine XR (EFFEXOR-XR) 75 MG 24 hr capsule Take 75 mg by mouth daily with breakfast.      zolpidem (AMBIEN) 10 MG tablet Take 10 mg by mouth at bedtime as needed for sleep.      No current facility-administered medications for this visit.   Allergies  Allergen Reactions   Chocolate Flavor Other (See Comments)    Migraine    Social History   Tobacco Use   Smoking status: Former    Types: Cigarettes    Quit date: 2017    Years since quitting: 5.6    Passive exposure: Yes   Smokeless tobacco: Never  Substance Use Topics   Alcohol use: Yes    Comment: OCCASIONAL, very rare, maybe once a month    No family history on file.   Review of Systems Pertinent items are noted in HPI.  Objective:   Vitals: Ht: 5 ft 4.5 in 03/28/2021 11:23 am BP: 144/88 03/28/2021 11:27 am  Clinical exam: Penny Morgan is a pleasant individual, who appears younger than their stated age. She is alert and orientated 3. No shortness of breath, chest pain.  Heart: Regular rate and rhythm, no rubs, murmurs, or gallops  Lungs: Clear to auscultation bilaterally  Abdomen is soft and non-tender, negative loss of bowel and bladder control, no rebound tenderness. Negative: skin lesions abrasions contusions Peripheral pulses: 2+ dorsalis pedis/posterior tibialis pulses bilaterally. Compartment soft and nontender.  Gait pattern: Altered gait pattern due to severe back and radicular left leg pain. Assistive devices: None  Neuro: 5/5 motor strength in the lower extremity bilaterally. Positive left straight leg raise test with reproduction of dysesthesias and pain predominantly in the L5 dermatome. Negative Babinski test, no clonus, symmetrical 1+ deep tendon reflexes at the knee and absent at the Achilles  Musculoskeletal: Moderate low back pain especially with range of motion. No obvious skin lesions abrasions or contusions. No SI joint pain.  X-rays of the lumbar spine demonstrate grade 1 degenerative anterolisthesis at L4-5. No acute fracture is seen. No scoliosis.  Lumbar MRI: completed on 01/20/21 was reviewed with the patient. [facility] was [facility]; I have independently reviewed the images as well as the radiology report. Moderate L4-5 spinal stenosis which has progressed from prior imaging studies. There is a left anterior medially directed synovial cyst impinging on the exiting L4 and L5 descending nerve root on the left side. Mild right L3-4 neural foraminal narrowing. Degenerative grade 1 listhesis L4-5. No significant stenosis L5-S1.  Assessment:   Penny Morgan is a pleasant 75 year old female with low back pain and radicular left leg  pain that has been ongoing for quite some time despite appropriate conservative care. She is scheduled for transfer of the lumbar interbody fusion at L4-5 with posterior spinal fusion instrumentation   Plan:   L4-5 transforaminal lumbar interbody fusion with posterior spinal fusion instrumentation  Risks and benefits of surgery were discussed with the patient. These include: Infection, bleeding, death, stroke, paralysis, ongoing or worse pain, need for additional surgery, nonunion, leak of spinal fluid, adjacent segment degeneration requiring additional fusion surgery, Injury to abdominal vessels that can require anterior surgery to stop bleeding. Malposition of the cage and/or pedicle screws that could require additional surgery. Loss of bowel and bladder control. Postoperative hematoma causing neurologic compression that could require urgent or emergent re-operation.   We have obtained preoperative medical the patient's primary care provider.  I reviewed patient's medication list with her. She is not on any blood thinners. She is artery started to hold her aspirin. Not taking the anti-inflammatories.  Patient got fitted for LSO brace this morning. Preop at [facility] this afternoon.  We have also discussed the post-operative recovery period to include: bathing/showering restrictions, wound healing, activity (and driving) restrictions, medications/pain mangement.  We have also discussed post-operative redflags to include: signs and symptoms of postoperative infection, DVT/PE.  A copy of discharge instructions were reviewed with patient and she was given a copy at today's visit.  All patients questions were invited and answered.  Follow-up: 2 weeks postop

## 2021-03-30 DIAGNOSIS — E785 Hyperlipidemia, unspecified: Secondary | ICD-10-CM | POA: Diagnosis not present

## 2021-03-30 DIAGNOSIS — I129 Hypertensive chronic kidney disease with stage 1 through stage 4 chronic kidney disease, or unspecified chronic kidney disease: Secondary | ICD-10-CM | POA: Diagnosis not present

## 2021-03-30 DIAGNOSIS — N1832 Chronic kidney disease, stage 3b: Secondary | ICD-10-CM | POA: Diagnosis not present

## 2021-03-30 DIAGNOSIS — E039 Hypothyroidism, unspecified: Secondary | ICD-10-CM | POA: Diagnosis not present

## 2021-04-05 ENCOUNTER — Other Ambulatory Visit (HOSPITAL_COMMUNITY)
Admission: RE | Admit: 2021-04-05 | Discharge: 2021-04-05 | Disposition: A | Discharge status: Home / Self Care | Payer: Medicare PPO | Source: Ambulatory Visit | Attending: Orthopedic Surgery | Admitting: Orthopedic Surgery

## 2021-04-05 DIAGNOSIS — M7138 Other bursal cyst, other site: Secondary | ICD-10-CM | POA: Diagnosis present

## 2021-04-05 DIAGNOSIS — M4316 Spondylolisthesis, lumbar region: Secondary | ICD-10-CM | POA: Diagnosis present

## 2021-04-05 DIAGNOSIS — E039 Hypothyroidism, unspecified: Secondary | ICD-10-CM | POA: Diagnosis present

## 2021-04-05 DIAGNOSIS — F419 Anxiety disorder, unspecified: Secondary | ICD-10-CM | POA: Diagnosis present

## 2021-04-05 DIAGNOSIS — K5730 Diverticulosis of large intestine without perforation or abscess without bleeding: Secondary | ICD-10-CM | POA: Diagnosis present

## 2021-04-05 DIAGNOSIS — Z91018 Allergy to other foods: Secondary | ICD-10-CM | POA: Diagnosis not present

## 2021-04-05 DIAGNOSIS — Z79899 Other long term (current) drug therapy: Secondary | ICD-10-CM | POA: Diagnosis not present

## 2021-04-05 DIAGNOSIS — M5116 Intervertebral disc disorders with radiculopathy, lumbar region: Secondary | ICD-10-CM | POA: Diagnosis not present

## 2021-04-05 DIAGNOSIS — M5416 Radiculopathy, lumbar region: Secondary | ICD-10-CM | POA: Diagnosis present

## 2021-04-05 DIAGNOSIS — M4326 Fusion of spine, lumbar region: Secondary | ICD-10-CM | POA: Diagnosis not present

## 2021-04-05 DIAGNOSIS — F32A Depression, unspecified: Secondary | ICD-10-CM | POA: Diagnosis present

## 2021-04-05 DIAGNOSIS — E669 Obesity, unspecified: Secondary | ICD-10-CM | POA: Diagnosis present

## 2021-04-05 DIAGNOSIS — K219 Gastro-esophageal reflux disease without esophagitis: Secondary | ICD-10-CM | POA: Diagnosis present

## 2021-04-05 DIAGNOSIS — Z7982 Long term (current) use of aspirin: Secondary | ICD-10-CM | POA: Diagnosis not present

## 2021-04-05 DIAGNOSIS — Z981 Arthrodesis status: Secondary | ICD-10-CM | POA: Diagnosis not present

## 2021-04-05 DIAGNOSIS — Z01812 Encounter for preprocedural laboratory examination: Secondary | ICD-10-CM | POA: Insufficient documentation

## 2021-04-05 DIAGNOSIS — Z87891 Personal history of nicotine dependence: Secondary | ICD-10-CM | POA: Diagnosis not present

## 2021-04-05 DIAGNOSIS — Z79890 Hormone replacement therapy (postmenopausal): Secondary | ICD-10-CM | POA: Diagnosis not present

## 2021-04-05 DIAGNOSIS — Z7952 Long term (current) use of systemic steroids: Secondary | ICD-10-CM | POA: Diagnosis not present

## 2021-04-05 DIAGNOSIS — Z20822 Contact with and (suspected) exposure to covid-19: Secondary | ICD-10-CM | POA: Insufficient documentation

## 2021-04-05 DIAGNOSIS — Z85820 Personal history of malignant melanoma of skin: Secondary | ICD-10-CM | POA: Diagnosis not present

## 2021-04-05 DIAGNOSIS — Z6828 Body mass index (BMI) 28.0-28.9, adult: Secondary | ICD-10-CM | POA: Diagnosis not present

## 2021-04-05 LAB — SARS CORONAVIRUS 2 (TAT 6-24 HRS): SARS Coronavirus 2: NEGATIVE

## 2021-04-06 ENCOUNTER — Other Ambulatory Visit: Payer: Self-pay

## 2021-04-06 ENCOUNTER — Encounter (HOSPITAL_COMMUNITY): Payer: Self-pay | Admitting: Orthopedic Surgery

## 2021-04-06 ENCOUNTER — Inpatient Hospital Stay (HOSPITAL_COMMUNITY)
Admission: RE | Admit: 2021-04-06 | Discharge: 2021-04-07 | DRG: 455 | Disposition: A | Discharge status: Home / Self Care | Payer: Medicare PPO | Attending: Orthopedic Surgery | Admitting: Orthopedic Surgery

## 2021-04-06 ENCOUNTER — Inpatient Hospital Stay (HOSPITAL_COMMUNITY): Payer: Medicare PPO | Admitting: Certified Registered"

## 2021-04-06 ENCOUNTER — Inpatient Hospital Stay (HOSPITAL_COMMUNITY): Payer: Medicare PPO

## 2021-04-06 ENCOUNTER — Encounter (HOSPITAL_COMMUNITY)
Admission: RE | Disposition: A | Discharge status: Home / Self Care | Payer: Self-pay | Source: Home / Self Care | Attending: Orthopedic Surgery

## 2021-04-06 DIAGNOSIS — Z7982 Long term (current) use of aspirin: Secondary | ICD-10-CM

## 2021-04-06 DIAGNOSIS — M7138 Other bursal cyst, other site: Secondary | ICD-10-CM | POA: Diagnosis present

## 2021-04-06 DIAGNOSIS — K219 Gastro-esophageal reflux disease without esophagitis: Secondary | ICD-10-CM | POA: Diagnosis present

## 2021-04-06 DIAGNOSIS — M5416 Radiculopathy, lumbar region: Secondary | ICD-10-CM | POA: Diagnosis present

## 2021-04-06 DIAGNOSIS — Z79899 Other long term (current) drug therapy: Secondary | ICD-10-CM | POA: Diagnosis not present

## 2021-04-06 DIAGNOSIS — M4316 Spondylolisthesis, lumbar region: Secondary | ICD-10-CM | POA: Diagnosis present

## 2021-04-06 DIAGNOSIS — Z79890 Hormone replacement therapy (postmenopausal): Secondary | ICD-10-CM

## 2021-04-06 DIAGNOSIS — Z981 Arthrodesis status: Secondary | ICD-10-CM

## 2021-04-06 DIAGNOSIS — M4326 Fusion of spine, lumbar region: Secondary | ICD-10-CM | POA: Diagnosis not present

## 2021-04-06 DIAGNOSIS — Z85820 Personal history of malignant melanoma of skin: Secondary | ICD-10-CM

## 2021-04-06 DIAGNOSIS — Z87891 Personal history of nicotine dependence: Secondary | ICD-10-CM | POA: Diagnosis not present

## 2021-04-06 DIAGNOSIS — Z6828 Body mass index (BMI) 28.0-28.9, adult: Secondary | ICD-10-CM | POA: Diagnosis not present

## 2021-04-06 DIAGNOSIS — F32A Depression, unspecified: Secondary | ICD-10-CM | POA: Diagnosis present

## 2021-04-06 DIAGNOSIS — E039 Hypothyroidism, unspecified: Secondary | ICD-10-CM | POA: Diagnosis present

## 2021-04-06 DIAGNOSIS — Z20822 Contact with and (suspected) exposure to covid-19: Secondary | ICD-10-CM | POA: Diagnosis present

## 2021-04-06 DIAGNOSIS — Z7952 Long term (current) use of systemic steroids: Secondary | ICD-10-CM

## 2021-04-06 DIAGNOSIS — Z419 Encounter for procedure for purposes other than remedying health state, unspecified: Secondary | ICD-10-CM

## 2021-04-06 DIAGNOSIS — E669 Obesity, unspecified: Secondary | ICD-10-CM | POA: Diagnosis present

## 2021-04-06 DIAGNOSIS — Z91018 Allergy to other foods: Secondary | ICD-10-CM | POA: Diagnosis not present

## 2021-04-06 DIAGNOSIS — K5730 Diverticulosis of large intestine without perforation or abscess without bleeding: Secondary | ICD-10-CM | POA: Diagnosis present

## 2021-04-06 DIAGNOSIS — F419 Anxiety disorder, unspecified: Secondary | ICD-10-CM | POA: Diagnosis present

## 2021-04-06 HISTORY — PX: TRANSFORAMINAL LUMBAR INTERBODY FUSION (TLIF) WITH PEDICLE SCREW FIXATION 1 LEVEL: SHX6141

## 2021-04-06 SURGERY — TRANSFORAMINAL LUMBAR INTERBODY FUSION (TLIF) WITH PEDICLE SCREW FIXATION 1 LEVEL
Anesthesia: General | Site: Spine Lumbar

## 2021-04-06 MED ORDER — FUROSEMIDE 20 MG PO TABS: 20.0000 mg | ORAL_TABLET | Freq: Every day | ORAL | Status: DC | PRN

## 2021-04-06 MED ORDER — BUPIVACAINE-EPINEPHRINE (PF) 0.25% -1:200000 IJ SOLN
INTRAMUSCULAR | Status: AC
  Filled 2021-04-06: qty 30

## 2021-04-06 MED ORDER — SODIUM CHLORIDE 0.9% FLUSH: 3.0000 mL | Freq: Two times a day (BID) | INTRAVENOUS | Status: DC

## 2021-04-06 MED ORDER — PHENYLEPHRINE HCL (PRESSORS) 10 MG/ML IV SOLN
INTRAVENOUS | Status: DC | PRN
  Administered 2021-04-06 (×2): 80 ug via INTRAVENOUS
  Administered 2021-04-06: 160 ug via INTRAVENOUS
  Administered 2021-04-06: 80 ug via INTRAVENOUS

## 2021-04-06 MED ORDER — OXYCODONE HCL 5 MG/5ML PO SOLN: 5.0000 mg | Freq: Once | ORAL | Status: DC | PRN

## 2021-04-06 MED ORDER — ACETAMINOPHEN 650 MG RE SUPP: 650.0000 mg | RECTAL | Status: DC | PRN

## 2021-04-06 MED ORDER — FENTANYL CITRATE (PF) 250 MCG/5ML IJ SOLN
INTRAMUSCULAR | Status: DC | PRN
  Administered 2021-04-06: 50 ug via INTRAVENOUS
  Administered 2021-04-06: 100 ug via INTRAVENOUS

## 2021-04-06 MED ORDER — PHENYLEPHRINE 40 MCG/ML (10ML) SYRINGE FOR IV PUSH (FOR BLOOD PRESSURE SUPPORT)
PREFILLED_SYRINGE | INTRAVENOUS | Status: AC
  Filled 2021-04-06: qty 10

## 2021-04-06 MED ORDER — ROSUVASTATIN CALCIUM 5 MG PO TABS: 5.0000 mg | ORAL_TABLET | ORAL | Status: DC

## 2021-04-06 MED ORDER — LACTATED RINGERS IV SOLN: INTRAVENOUS | Status: DC

## 2021-04-06 MED ORDER — PROPOFOL 10 MG/ML IV BOLUS
INTRAVENOUS | Status: DC | PRN
  Administered 2021-04-06: 100 mg via INTRAVENOUS

## 2021-04-06 MED ORDER — BUPIVACAINE-EPINEPHRINE 0.25% -1:200000 IJ SOLN
INTRAMUSCULAR | Status: DC | PRN
  Administered 2021-04-06: 10 mL

## 2021-04-06 MED ORDER — HYDROMORPHONE HCL 1 MG/ML IJ SOLN: 0.5000 mg | INTRAMUSCULAR | Status: DC | PRN

## 2021-04-06 MED ORDER — ALBUMIN HUMAN 5 % IV SOLN: INTRAVENOUS | Status: DC | PRN

## 2021-04-06 MED ORDER — KETAMINE HCL 50 MG/5ML IJ SOSY
PREFILLED_SYRINGE | INTRAMUSCULAR | Status: AC
  Filled 2021-04-06: qty 5

## 2021-04-06 MED ORDER — ONDANSETRON HCL 4 MG/2ML IJ SOLN: 4.0000 mg | Freq: Once | INTRAMUSCULAR | Status: DC | PRN

## 2021-04-06 MED ORDER — GELATIN ABSORBABLE 100 EX MISC
CUTANEOUS | Status: DC | PRN
  Administered 2021-04-06: 1

## 2021-04-06 MED ORDER — ACETAMINOPHEN 10 MG/ML IV SOLN
INTRAVENOUS | Status: AC
  Filled 2021-04-06: qty 100

## 2021-04-06 MED ORDER — ONDANSETRON HCL 4 MG/2ML IJ SOLN: 4.0000 mg | Freq: Four times a day (QID) | INTRAMUSCULAR | Status: DC | PRN

## 2021-04-06 MED ORDER — OXYCODONE HCL 5 MG PO TABS: 5.0000 mg | ORAL_TABLET | Freq: Once | ORAL | Status: DC | PRN

## 2021-04-06 MED ORDER — POLYETHYLENE GLYCOL 3350 17 G PO PACK: 17.0000 g | PACK | Freq: Every day | ORAL | Status: DC | PRN

## 2021-04-06 MED ORDER — METHOCARBAMOL 500 MG PO TABS
500.0000 mg | ORAL_TABLET | Freq: Four times a day (QID) | ORAL | Status: DC | PRN
  Administered 2021-04-06 – 2021-04-07 (×2): 500 mg via ORAL
  Filled 2021-04-06 (×2): qty 1

## 2021-04-06 MED ORDER — SODIUM CHLORIDE 0.9 % IV SOLN: 250.0000 mL | INTRAVENOUS | Status: DC

## 2021-04-06 MED ORDER — LOSARTAN POTASSIUM 50 MG PO TABS
50.0000 mg | ORAL_TABLET | Freq: Every day | ORAL | Status: DC
  Administered 2021-04-07: 50 mg via ORAL
  Filled 2021-04-06: qty 1

## 2021-04-06 MED ORDER — VENLAFAXINE HCL ER 75 MG PO CP24
75.0000 mg | ORAL_CAPSULE | Freq: Every day | ORAL | Status: DC
  Administered 2021-04-07: 75 mg via ORAL
  Filled 2021-04-06: qty 1

## 2021-04-06 MED ORDER — KETAMINE HCL 10 MG/ML IJ SOLN
INTRAMUSCULAR | Status: DC | PRN
  Administered 2021-04-06: 10 mg via INTRAVENOUS
  Administered 2021-04-06: 5 mg via INTRAVENOUS
  Administered 2021-04-06: 10 mg via INTRAVENOUS

## 2021-04-06 MED ORDER — EPHEDRINE 5 MG/ML INJ
INTRAVENOUS | Status: AC
  Filled 2021-04-06: qty 5

## 2021-04-06 MED ORDER — AMISULPRIDE (ANTIEMETIC) 5 MG/2ML IV SOLN: 10.0000 mg | Freq: Once | INTRAVENOUS | Status: DC | PRN

## 2021-04-06 MED ORDER — ALBUTEROL SULFATE (2.5 MG/3ML) 0.083% IN NEBU: 3.0000 mL | INHALATION_SOLUTION | Freq: Four times a day (QID) | RESPIRATORY_TRACT | Status: DC | PRN

## 2021-04-06 MED ORDER — TRANEXAMIC ACID-NACL 1000-0.7 MG/100ML-% IV SOLN
INTRAVENOUS | Status: AC
  Filled 2021-04-06: qty 100

## 2021-04-06 MED ORDER — OXYCODONE HCL 5 MG PO TABS
10.0000 mg | ORAL_TABLET | ORAL | Status: DC | PRN
Start: 2021-04-06 — End: 2021-04-07
  Administered 2021-04-06 – 2021-04-07 (×2): 10 mg via ORAL
  Filled 2021-04-06 (×2): qty 2

## 2021-04-06 MED ORDER — HYDROMORPHONE HCL 1 MG/ML IJ SOLN
INTRAMUSCULAR | Status: AC
  Filled 2021-04-06: qty 0.5

## 2021-04-06 MED ORDER — MENTHOL 3 MG MT LOZG: 1.0000 | LOZENGE | OROMUCOSAL | Status: DC | PRN

## 2021-04-06 MED ORDER — DEXAMETHASONE SODIUM PHOSPHATE 10 MG/ML IJ SOLN
INTRAMUSCULAR | Status: DC | PRN
  Administered 2021-04-06: 10 mg via INTRAVENOUS

## 2021-04-06 MED ORDER — PHENYLEPHRINE HCL-NACL 20-0.9 MG/250ML-% IV SOLN
INTRAVENOUS | Status: DC | PRN
  Administered 2021-04-06: 30 ug/min via INTRAVENOUS

## 2021-04-06 MED ORDER — FLEET ENEMA 7-19 GM/118ML RE ENEM
1.0000 | ENEMA | Freq: Once | RECTAL | Status: DC | PRN
Start: 2021-04-06 — End: 2021-04-07

## 2021-04-06 MED ORDER — 0.9 % SODIUM CHLORIDE (POUR BTL) OPTIME
TOPICAL | Status: DC | PRN
  Administered 2021-04-06 (×2): 1000 mL

## 2021-04-06 MED ORDER — ORAL CARE MOUTH RINSE: 15.0000 mL | Freq: Once | OROMUCOSAL | Status: AC

## 2021-04-06 MED ORDER — CHLORHEXIDINE GLUCONATE 0.12 % MT SOLN
15.0000 mL | Freq: Once | OROMUCOSAL | Status: AC
  Administered 2021-04-06: 15 mL via OROMUCOSAL
  Filled 2021-04-06: qty 15

## 2021-04-06 MED ORDER — FENTANYL CITRATE (PF) 250 MCG/5ML IJ SOLN
INTRAMUSCULAR | Status: AC
  Filled 2021-04-06: qty 5

## 2021-04-06 MED ORDER — TRANEXAMIC ACID-NACL 1000-0.7 MG/100ML-% IV SOLN
INTRAVENOUS | Status: DC | PRN
  Administered 2021-04-06: 1000 mg via INTRAVENOUS

## 2021-04-06 MED ORDER — CEFAZOLIN SODIUM-DEXTROSE 2-4 GM/100ML-% IV SOLN
2.0000 g | INTRAVENOUS | Status: AC
  Administered 2021-04-06: 2 g via INTRAVENOUS
  Filled 2021-04-06: qty 100

## 2021-04-06 MED ORDER — CEFAZOLIN SODIUM-DEXTROSE 1-4 GM/50ML-% IV SOLN
1.0000 g | Freq: Three times a day (TID) | INTRAVENOUS | Status: AC
  Administered 2021-04-06 – 2021-04-07 (×2): 1 g via INTRAVENOUS
  Filled 2021-04-06 (×2): qty 50

## 2021-04-06 MED ORDER — ACETAMINOPHEN 10 MG/ML IV SOLN
INTRAVENOUS | Status: DC | PRN
  Administered 2021-04-06: 1000 mg via INTRAVENOUS

## 2021-04-06 MED ORDER — ONDANSETRON HCL 4 MG PO TABS: 4.0000 mg | ORAL_TABLET | Freq: Four times a day (QID) | ORAL | Status: DC | PRN

## 2021-04-06 MED ORDER — ACETAMINOPHEN 325 MG PO TABS
650.0000 mg | ORAL_TABLET | ORAL | Status: DC | PRN
  Administered 2021-04-06 – 2021-04-07 (×4): 650 mg via ORAL
  Filled 2021-04-06 (×4): qty 2

## 2021-04-06 MED ORDER — OXYCODONE HCL 5 MG PO TABS: 5.0000 mg | ORAL_TABLET | ORAL | Status: DC | PRN

## 2021-04-06 MED ORDER — SUMATRIPTAN SUCCINATE 50 MG PO TABS
50.0000 mg | ORAL_TABLET | ORAL | Status: DC | PRN
  Filled 2021-04-06: qty 1

## 2021-04-06 MED ORDER — RIZATRIPTAN BENZOATE 10 MG PO TBDP: 10.0000 mg | ORAL_TABLET | ORAL | Status: DC | PRN

## 2021-04-06 MED ORDER — BISACODYL 10 MG RE SUPP: 10.0000 mg | Freq: Every day | RECTAL | Status: DC | PRN

## 2021-04-06 MED ORDER — THROMBIN 20000 UNITS EX SOLR: CUTANEOUS | Status: DC | PRN

## 2021-04-06 MED ORDER — HYDROMORPHONE HCL 1 MG/ML IJ SOLN
INTRAMUSCULAR | Status: DC | PRN
  Administered 2021-04-06: .5 mg via INTRAVENOUS

## 2021-04-06 MED ORDER — GABAPENTIN 300 MG PO CAPS
300.0000 mg | ORAL_CAPSULE | Freq: Three times a day (TID) | ORAL | Status: DC
  Administered 2021-04-06 – 2021-04-07 (×2): 300 mg via ORAL
  Filled 2021-04-06 (×2): qty 1

## 2021-04-06 MED ORDER — HYDROMORPHONE HCL 1 MG/ML IJ SOLN: 0.2500 mg | INTRAMUSCULAR | Status: DC | PRN

## 2021-04-06 MED ORDER — LEVOTHYROXINE SODIUM 100 MCG PO TABS: 100.0000 ug | ORAL_TABLET | Freq: Every evening | ORAL | Status: DC

## 2021-04-06 MED ORDER — ARTIFICIAL TEARS OPHTHALMIC OINT
TOPICAL_OINTMENT | OPHTHALMIC | Status: DC | PRN
  Administered 2021-04-06: 1 via OPHTHALMIC

## 2021-04-06 MED ORDER — THROMBIN 20000 UNITS EX SOLR
CUTANEOUS | Status: AC
  Filled 2021-04-06: qty 20000

## 2021-04-06 MED ORDER — ONDANSETRON HCL 4 MG/2ML IJ SOLN
INTRAMUSCULAR | Status: DC | PRN
  Administered 2021-04-06: 4 mg via INTRAVENOUS

## 2021-04-06 MED ORDER — LIDOCAINE 2% (20 MG/ML) 5 ML SYRINGE
INTRAMUSCULAR | Status: DC | PRN
Start: 2021-04-06 — End: 2021-04-06
  Administered 2021-04-06: 100 mg via INTRAVENOUS

## 2021-04-06 MED ORDER — ACETAMINOPHEN 10 MG/ML IV SOLN: 1000.0000 mg | Freq: Once | INTRAVENOUS | Status: DC | PRN

## 2021-04-06 MED ORDER — METHOCARBAMOL 1000 MG/10ML IJ SOLN
500.0000 mg | Freq: Four times a day (QID) | INTRAVENOUS | Status: DC | PRN
  Filled 2021-04-06: qty 5

## 2021-04-06 MED ORDER — SODIUM CHLORIDE 0.9% FLUSH: 3.0000 mL | INTRAVENOUS | Status: DC | PRN

## 2021-04-06 MED ORDER — EPHEDRINE SULFATE-NACL 50-0.9 MG/10ML-% IV SOSY
PREFILLED_SYRINGE | INTRAVENOUS | Status: DC | PRN
  Administered 2021-04-06: 5 mg via INTRAVENOUS
  Administered 2021-04-06 (×2): 10 mg via INTRAVENOUS
  Administered 2021-04-06: 5 mg via INTRAVENOUS

## 2021-04-06 MED ORDER — PHENOL 1.4 % MT LIQD: 1.0000 | OROMUCOSAL | Status: DC | PRN

## 2021-04-06 MED ORDER — SUCCINYLCHOLINE CHLORIDE 200 MG/10ML IV SOSY
PREFILLED_SYRINGE | INTRAVENOUS | Status: DC | PRN
  Administered 2021-04-06: 100 mg via INTRAVENOUS

## 2021-04-06 SURGICAL SUPPLY — 72 items
AGENT HMST KT MTR STRL THRMB (HEMOSTASIS)
BAG COUNTER SPONGE SURGICOUNT (BAG) ×2 IMPLANT
BAG SPNG CNTER NS LX DISP (BAG) ×1
BLADE CLIPPER SURG (BLADE) IMPLANT
BUR EGG ELITE 4.0 (BURR) IMPLANT
CABLE BIPOLOR RESECTION CORD (MISCELLANEOUS) ×2 IMPLANT
CANISTER SUCT 3000ML PPV (MISCELLANEOUS) ×2 IMPLANT
CAP RELINE MOD TULIP RMM (Cap) ×4 IMPLANT
CLIP PULSE STIMULATION (NEUROSURGERY SUPPLIES) ×1 IMPLANT
CLSR STERI-STRIP ANTIMIC 1/2X4 (GAUZE/BANDAGES/DRESSINGS) ×2 IMPLANT
COVER SURGICAL LIGHT HANDLE (MISCELLANEOUS) ×2 IMPLANT
DRAIN CHANNEL 15F RND FF W/TCR (WOUND CARE) IMPLANT
DRAPE C-ARM 42X72 X-RAY (DRAPES) ×2 IMPLANT
DRAPE C-ARMOR (DRAPES) ×2 IMPLANT
DRAPE POUCH INSTRU U-SHP 10X18 (DRAPES) ×2 IMPLANT
DRAPE SURG 17X23 STRL (DRAPES) ×2 IMPLANT
DRAPE U-SHAPE 47X51 STRL (DRAPES) ×2 IMPLANT
DRSG OPSITE POSTOP 4X6 (GAUZE/BANDAGES/DRESSINGS) ×1 IMPLANT
DRSG OPSITE POSTOP 4X8 (GAUZE/BANDAGES/DRESSINGS) ×2 IMPLANT
DURAPREP 26ML APPLICATOR (WOUND CARE) ×2 IMPLANT
ELECT BLADE 4.0 EZ CLEAN MEGAD (MISCELLANEOUS) ×2
ELECT BLADE 6.5 EXT (BLADE) IMPLANT
ELECT PENCIL ROCKER SW 15FT (MISCELLANEOUS) ×2 IMPLANT
ELECT REM PT RETURN 9FT ADLT (ELECTROSURGICAL) ×2
ELECTRODE BLDE 4.0 EZ CLN MEGD (MISCELLANEOUS) ×1 IMPLANT
ELECTRODE REM PT RTRN 9FT ADLT (ELECTROSURGICAL) ×1 IMPLANT
GLOVE SURG MICRO LTX SZ8.5 (GLOVE) ×2 IMPLANT
GLOVE SURG UNDER POLY LF SZ8.5 (GLOVE) ×2 IMPLANT
GOWN STRL REUS W/ TWL LRG LVL3 (GOWN DISPOSABLE) ×1 IMPLANT
GOWN STRL REUS W/TWL 2XL LVL3 (GOWN DISPOSABLE) ×4 IMPLANT
GOWN STRL REUS W/TWL LRG LVL3 (GOWN DISPOSABLE) ×2
IMPL TLX20 9X11X26 20D (Cage) IMPLANT
KIT BASIN OR (CUSTOM PROCEDURE TRAY) ×2 IMPLANT
KIT POSITION SURG JACKSON T1 (MISCELLANEOUS) IMPLANT
KIT PULSE MIOM NDL (NEUROSURGERY SUPPLIES) IMPLANT
KIT PULSE MIOM NEEDLE (NEUROSURGERY SUPPLIES) ×2 IMPLANT
KIT PULSE MIOM SURFACE (NEUROSURGERY SUPPLIES) ×1 IMPLANT
KIT TURNOVER KIT B (KITS) ×2 IMPLANT
NDL SPNL 18GX3.5 QUINCKE PK (NEEDLE) ×2 IMPLANT
NEEDLE 22X1 1/2 (OR ONLY) (NEEDLE) ×2 IMPLANT
NEEDLE SPNL 18GX3.5 QUINCKE PK (NEEDLE) ×4 IMPLANT
NS IRRIG 1000ML POUR BTL (IV SOLUTION) ×2 IMPLANT
PACK LAMINECTOMY ORTHO (CUSTOM PROCEDURE TRAY) ×2 IMPLANT
PACK UNIVERSAL I (CUSTOM PROCEDURE TRAY) ×2 IMPLANT
PAD ARMBOARD 7.5X6 YLW CONV (MISCELLANEOUS) ×4 IMPLANT
PATTIES SURGICAL .5 X.5 (GAUZE/BANDAGES/DRESSINGS) ×1 IMPLANT
PATTIES SURGICAL .5 X1 (DISPOSABLE) ×2 IMPLANT
POSITIONER HEAD PRONE TRACH (MISCELLANEOUS) ×2 IMPLANT
PROBE PULSE STIMULATION (NEUROSURGERY SUPPLIES) ×1 IMPLANT
ROD RELINE COCR LORD 5.0X35 (Rod) ×1 IMPLANT
ROD RELINE COCR LORD 5X30 (Rod) ×1 IMPLANT
SCREW LOCK RSS 4.5/5.0MM (Screw) ×4 IMPLANT
SCREW SHANK RELINE MOD 5.5X35 (Screw) ×2 IMPLANT
SHANK RELINE MOD 5.5X40 (Screw) ×2 IMPLANT
SPONGE SURGIFOAM ABS GEL 100 (HEMOSTASIS) ×2 IMPLANT
SPONGE T-LAP 4X18 ~~LOC~~+RFID (SPONGE) ×4 IMPLANT
SURGIFLO W/THROMBIN 8M KIT (HEMOSTASIS) IMPLANT
SUT BONE WAX W31G (SUTURE) ×2 IMPLANT
SUT MNCRL AB 3-0 PS2 27 (SUTURE) ×4 IMPLANT
SUT MON AB 3-0 SH 27 (SUTURE) ×2
SUT MON AB 3-0 SH27 (SUTURE) IMPLANT
SUT VIC AB 1 CT1 18XCR BRD 8 (SUTURE) ×1 IMPLANT
SUT VIC AB 1 CT1 8-18 (SUTURE) ×4
SUT VIC AB 2-0 CT1 18 (SUTURE) ×2 IMPLANT
SYR BULB IRRIG 60ML STRL (SYRINGE) ×2 IMPLANT
SYR CONTROL 10ML LL (SYRINGE) ×2 IMPLANT
TLX20 IMPLANT 9X11X26 20D (Cage) ×2 IMPLANT
TOWEL GREEN STERILE (TOWEL DISPOSABLE) ×2 IMPLANT
TOWEL GREEN STERILE FF (TOWEL DISPOSABLE) ×2 IMPLANT
TRAY FOLEY MTR SLVR 16FR STAT (SET/KITS/TRAYS/PACK) ×2 IMPLANT
WATER STERILE IRR 1000ML POUR (IV SOLUTION) ×2 IMPLANT
YANKAUER SUCT BULB TIP NO VENT (SUCTIONS) ×2 IMPLANT

## 2021-04-06 NOTE — H&P (Signed)
Addendum H&P: There is been no change in the patient's clinical exam since her last office visit of 03/28/2021.  Plan on moving forward with L4-5 decompression and instrumented fusion with interbody fixation for back buttock and radicular left leg pain.  All appropriate risks, benefits, and alternatives to surgery were discussed with the patient and consent was obtained.  All of her questions were addressed and she indicated willingness to move forward with surgery.

## 2021-04-06 NOTE — Brief Op Note (Signed)
04/06/2021  6:36 PM  PATIENT:  Penny Morgan  75 y.o. female  PRE-OPERATIVE DIAGNOSIS:  L4-5 synovial cyst with degenerative slip and radicular leg pain  POST-OPERATIVE DIAGNOSIS:  L4-5 synovial cyst with degenerative slip and radicular leg pain  PROCEDURE:  Procedure(s) with comments: TRANSFORAMINAL LUMBAR INTERBODY FUSION (TLIF) WITH PEDICLE SCREW FIXATION 1 LEVEL L4-5 (N/A) - 5 hrs 3C-Bed  SURGEON:  Surgeon(s) and Role:    Melina Schools, MD - Primary  PHYSICIAN ASSISTANT:   ASSISTANTS: Nelson Chimes, PA  ANESTHESIA:   general  EBL:  200 mL   BLOOD ADMINISTERED:none  DRAINS: none   LOCAL MEDICATIONS USED:  MARCAINE     SPECIMEN:  No Specimen  DISPOSITION OF SPECIMEN:  N/A  COUNTS:  YES  TOURNIQUET:  * No tourniquets in log *  DICTATION: .Dragon Dictation  PLAN OF CARE: Admit to inpatient   PATIENT DISPOSITION:  PACU - hemodynamically stable.

## 2021-04-06 NOTE — Anesthesia Procedure Notes (Signed)
Procedure Name: Intubation Date/Time: 04/06/2021 2:14 PM Performed by: Annamary Carolin, CRNA Pre-anesthesia Checklist: Patient identified, Emergency Drugs available, Suction available and Patient being monitored Patient Re-evaluated:Patient Re-evaluated prior to induction Oxygen Delivery Method: Circle System Utilized Preoxygenation: Pre-oxygenation with 100% oxygen Induction Type: IV induction Ventilation: Mask ventilation without difficulty Laryngoscope Size: Mac and 3 Grade View: Grade I Tube type: Oral Number of attempts: 1 Placement Confirmation: ETT inserted through vocal cords under direct vision, positive ETCO2 and breath sounds checked- equal and bilateral Secured at: 21 cm Tube secured with: Tape Dental Injury: Teeth and Oropharynx as per pre-operative assessment  Comments: With ease.

## 2021-04-06 NOTE — Anesthesia Preprocedure Evaluation (Addendum)
Anesthesia Evaluation  Patient identified by MRN, date of birth, ID band Patient awake    Reviewed: Allergy & Precautions, NPO status , Patient's Chart, lab work & pertinent test results  Airway Mallampati: II  TM Distance: >3 FB Neck ROM: Full    Dental no notable dental hx. (+) Upper Dentures, Dental Advisory Given   Pulmonary neg pulmonary ROS, former smoker,    Pulmonary exam normal breath sounds clear to auscultation       Cardiovascular hypertension, Pt. on medications Normal cardiovascular exam Rhythm:Regular Rate:Normal     Neuro/Psych  Headaches, PSYCHIATRIC DISORDERS Anxiety Depression    GI/Hepatic Neg liver ROS, GERD  ,  Endo/Other  Hypothyroidism   Renal/GU Lab Results      Component                Value               Date                      CREATININE               1.17 (H)            03/28/2021                BUN                      16                  03/28/2021                NA                       138                 03/28/2021                K                        3.9                 03/28/2021                CL                       103                 03/28/2021                CO2                      29                  03/28/2021                Musculoskeletal  (+) Arthritis ,   Abdominal   Peds  Hematology Lab Results      Component                Value               Date                      WBC  10.0                03/28/2021                HGB                      13.8                03/28/2021                HCT                      43.4                03/28/2021                MCV                      91.4                03/28/2021                PLT                      370                 03/28/2021              Anesthesia Other Findings   Reproductive/Obstetrics                            Anesthesia Physical Anesthesia  Plan  ASA: 3  Anesthesia Plan: General   Post-op Pain Management:    Induction: Intravenous  PONV Risk Score and Plan: 4 or greater and Treatment may vary due to age or medical condition, Ondansetron, Midazolam and Dexamethasone  Airway Management Planned: Oral ETT  Additional Equipment: None  Intra-op Plan:   Post-operative Plan: Extubation in OR  Informed Consent: I have reviewed the patients History and Physical, chart, labs and discussed the procedure including the risks, benefits and alternatives for the proposed anesthesia with the patient or authorized representative who has indicated his/her understanding and acceptance.     Dental advisory given  Plan Discussed with: CRNA and Anesthesiologist  Anesthesia Plan Comments:        Anesthesia Quick Evaluation

## 2021-04-06 NOTE — Transfer of Care (Signed)
Immediate Anesthesia Transfer of Care Note  Patient: Penny Morgan  Procedure(s) Performed: TRANSFORAMINAL LUMBAR INTERBODY FUSION (TLIF) WITH PEDICLE SCREW FIXATION 1 LEVEL L4-5 (Spine Lumbar)  Patient Location: PACU  Anesthesia Type:General  Level of Consciousness: drowsy and patient cooperative  Airway & Oxygen Therapy: Patient Spontanous Breathing and Patient connected to face mask oxygen  Post-op Assessment: Report given to RN and Patient moving all extremities X 4  Post vital signs: Reviewed and stable  Last Vitals:  Vitals Value Taken Time  BP 114/62 04/06/21 1857  Temp    Pulse 113 04/06/21 1858  Resp 19 04/06/21 1858  SpO2 100 % 04/06/21 1858  Vitals shown include unvalidated device data.  Last Pain:  Vitals:   04/06/21 1317  TempSrc:   PainSc: 0-No pain         Complications: No notable events documented.

## 2021-04-06 NOTE — Progress Notes (Signed)
Foley cath removed patient tolerated procedure well

## 2021-04-06 NOTE — Op Note (Signed)
OPERATIVE REPORT  DATE OF SURGERY: 04/06/2021  PATIENT NAME:  Penny Morgan MRN: [id number] DOB: Dec 24, 1945  PCP: Jalene Mullet, PA-C  PRE-OPERATIVE DIAGNOSIS: Degenerative spondylolisthesis L4-5 with a left facet cyst causing radiculopathy in the L4 and L5 dermatome.Marland Kitchen  POST-OPERATIVE DIAGNOSIS: Same  PROCEDURE:   Transforaminal lumbar interbody fusion L4-5.  Posterior lateral arthrodesis L4-5.  SURGEON:  Melina Schools, MD  PHYSICIAN ASSISTANT: Nelson Chimes, PA  ANESTHESIA:   General  EBL: [id number]   Complications: None  Implants: NuVasive expandable TL X cage.  9 x 11 x 26 with 20 degree lordosis.  Cage was expanded to its maximum of 14 mm height.  Graft: Autograft  Neuro monitoring.  There were no adverse intraoperative events noted on SSEP or free running EMG activity.  All 4 pedicle screws were directly stimulated and there is no evidence of breach or nerve activity.  BRIEF HISTORY: Penny Morgan is a 75 y.o. female who presents with significant back buttock and radicular left leg pain.  Imaging studies demonstrated degenerative spondylolisthesis with stenosis and nerve compression.  There is also a synovial cyst on the left side causing additional nerve root compression.  Attempts at conservative management have failed to alleviate her symptoms and so he elected to move forward with surgery.  All appropriate risks, benefits, alternatives were discussed with the patient and consent was obtained.  PROCEDURE DETAILS: Patient was brought into the operating room and was properly positioned on the operating room table.  A Foley,teds, and SCDs were applied.  After induction with general anesthesia the patient was endotracheally intubated.  A timeout was taken to confirm all important data: Including patient, procedure, and the level.  The patient was turned prone onto the Wilson frame and all bony prominences were well-padded.  The neuro monitoring representative applied all  appropriate leads and the back was prepped and draped in a standard fashion.  Using fluoroscopy identified the superior aspect of the L4 pedicle as well as the superior aspect of the S1 pedicle.  I marked out my incision site and infiltrated with quarter percent Marcaine with epinephrine.  A midline incision was made and sharp dissection was carried out down to the deep fascia.  Deep fascia was sharply incised and I stripped the paraspinal muscles to expose the L4, L5, and a portion of the S1 spinous process bilaterally.  Then exposed over the L4-5 and L5-S1 facet complex.  I then identified the L4 pedicle and marked this with a Bovie.  Using a high-speed bur identified the 7 o'clock position on the right pedicle and the 5 o'clock position on the left pedicle and marked bone.  I then used the pedicle all to place a standard cortical screw at this level.  I advanced the pedicle awl into the L4 to the midportion of the pedicle I confirmed on the lateral view that I was in the midportion of the pedicle and my trajectory was correct.  I advanced the awl to the appropriate depth.  I then remove the all and sounded the canal with a ball-tipped feeler.  I then placed the tap and again removed it and then sounded to ensure I had a solid bony canal.  I then placed the cortical L4 pedicle screw.  This had excellent purchase.  Using this exact same technique I placed the contralateral screw as well as the L5 cortical pedicle screws.  All 4 pedicle screws had excellent purchase.  All were directly stimulated and there was no  adverse activity.  At this point I began my decompression.  Using a double-action Leksell rongeur I remove the entire portion of the L4 spinous process.  All bone from the decompression was saved for later use.  I then dissected and performed a hemilaminotomy of L4.  There was significant thickening of the ligamentum flavum.  I gently dissected through the ligamentum flavum with a Penfield 4 and began  resecting it with a Kerrison rongeur and pituitary rondure.  Once I see identify the central raphae I dissected through and remove the central portion of the ligamentum flavum to expose the dorsal surface of the thecal sac.  I then continue to work into the lateral recess bilaterally removing the ligamentum flavum to decompress the thecal sac.  I continued into the lateral recess until I was able to identify the right L5 pedicle and the right L5 nerve root.  I performed a medial facetectomy to adequately decompressed the L5 nerve root in the lateral recess.  With the right side decompressed I could now retract the thecal sac with greater ease in order to decompress the left side.  The inferior L4 facet was removed in its entirety and the majority of the superior portion of the L5 facet was removed with a double-action Leksell rongeur and osteotome.  The L4 pars was completely resected with a Kerrison rongeur.  I was able to identify the L4 nerve root and protected with a neuro patty.  I then continued removing the ligamentum flavum to expose the thecal sac.  I eventually was able to identify the posterior annulus.  At this point I then continued my decompression inferiorly to adequately decompress the L5 nerve root in the lateral recess and along the medial border the pedicle and into the L5 foramen.  The neural decompression complete I then placed neuro patties.  Protection of the thecal sac and then a nerve root retractor.  The annulotomy was performed with a 15 blade scalpel.  Using pituitary rongeurs curettes and Kerrison rongeurs I removed all of the disc material.  I scraped the endplates to remove the cartilaginous portion and expose the bleeding subchondral bone.  I confirmed with fluoroscopy that had adequately removed the bulk of the disc material and had decompressed the area.  I then used the smooth trials to determine that the size 9 bendable cage would be the appropriate fit.  After copious  irrigation and confirming again adequate decompression and discectomy I placed the cage into the disc space.  I confirmed proper position in both the AP and lateral planes.  The tip of the device was beyond the midline as based on the spinous process.  The cage was then expanded to the maximum height.  I then backfilled the cage with the remaining autograft.  I remove the inserting device and confirmed that the posterior aspect of the cage was within the disc space and was not compromising or contacting the exiting L4 or traversing L5 nerve root.  With the discectomy complete I then placed additional bone graft lateral to the cage in the disc space.  The polyaxial heads were then applied and I decorticated the facet joint at L4-5.  Additional bone graft was placed in the right posterior lateral gutter to further encourage the fusion.  The rods were then secured to the cortical screw heads and locked into position.  The locking caps were inserted and torqued appropriately according manufacture standards.  At this point I took a final evaluation confirming I had  adequate decompression both right lateral and left lateral recess.  The L4 nerve root was completely decompressed as was the L5 nerve root.  The cage was properly seated.  Hemostasis was then obtained using bipolar electrocautery as well as Floseal.  After final copious irrigation I did place a thrombin-soaked Gelfoam patty over the exposed thecal sac.  I then remove the retractors took my final x-rays and confirmed satisfactory position of the intervertebral cage and pedicle screw rod construct.  The deep fascia was then closed with interrupted #1 Vicryl suture, then a layer 2-0 Vicryl suture, and finally 3-0 Monocryl for the skin.  Steri-Strips and dry dressings were applied and the patient was ultimately extubated transferred to PACU without incident.  The end of the case all needle sponge counts were correct.  There were no adverse intraoperative  events.  Melina Schools, MD 04/06/2021 6:21 PM

## 2021-04-07 ENCOUNTER — Encounter (HOSPITAL_COMMUNITY): Payer: Self-pay | Admitting: Orthopedic Surgery

## 2021-04-07 MED ORDER — ONDANSETRON HCL 4 MG PO TABS: 4.0000 mg | ORAL_TABLET | Freq: Three times a day (TID) | ORAL | 0 refills | Status: DC | PRN

## 2021-04-07 MED ORDER — METHOCARBAMOL 500 MG PO TABS: 500.0000 mg | ORAL_TABLET | Freq: Three times a day (TID) | ORAL | 0 refills | Status: AC | PRN

## 2021-04-07 MED ORDER — OXYCODONE-ACETAMINOPHEN 10-325 MG PO TABS: 1.0000 | ORAL_TABLET | Freq: Four times a day (QID) | ORAL | 0 refills | Status: AC | PRN

## 2021-04-07 NOTE — Progress Notes (Signed)
    Subjective: Procedure(s) (LRB): TRANSFORAMINAL LUMBAR INTERBODY FUSION (TLIF) WITH PEDICLE SCREW FIXATION 1 LEVEL L4-5 (N/A) 1 Day Post-Op  Patient reports pain as 1 on 0-10 scale.  Reports decreased leg pain reports incisional back pain   Positive void Negative bowel movement Positive flatus Negative chest pain or shortness of breath  Objective: Vital signs in last 24 hours: Temp:  [97.2 F (36.2 C)-98.4 F (36.9 C)] 98.4 F (36.9 C) (09/08 0805) Pulse Rate:  [92-110] 100 (09/08 0805) Resp:  [13-21] 16 (09/08 0805) BP: (104-141)/(62-89) 116/76 (09/08 0805) SpO2:  [94 %-99 %] 94 % (09/08 0805) Weight:  [79.4 kg] 79.4 kg (09/07 1245)  Intake/Output from previous day: 09/07 0701 - 09/08 0700 In: 2040 [P.O.:240; I.V.:1000; IV Piggyback:800] Out: 360 [Urine:160; Blood:200]  Labs: No results for input(s): WBC, RBC, HCT, PLT in the last 72 hours. No results for input(s): NA, K, CL, CO2, BUN, CREATININE, GLUCOSE, CALCIUM in the last 72 hours. No results for input(s): LABPT, INR in the last 72 hours.  Physical Exam: Neurologically intact ABD soft Intact pulses distally Dorsiflexion/Plantar flexion intact Incision: dressing C/D/I Compartment soft Body mass index is 28.68 kg/m.   Assessment/Plan: Patient stable  xrays n/a Continue mobilization with physical therapy Continue care  Patient doing well overall. Ambulating with no assistive device.  Preoperative radicular leg pain resolved.  Minor incisional back pain. Positive flatus.  No abdominal distention or tenderness.  Tolerating regular diet. Plan on discharge to home today if she is cleared by PT/OT.  Patient will follow-up with me in 2 weeks for wound check.  Postoperative instructions have been provided.  Melina Schools, MD [facility] [phone]

## 2021-04-07 NOTE — Discharge Instructions (Signed)

## 2021-04-07 NOTE — Anesthesia Postprocedure Evaluation (Signed)
Anesthesia Post Note  Patient: Penny Morgan  Procedure(s) Performed: TRANSFORAMINAL LUMBAR INTERBODY FUSION (TLIF) WITH PEDICLE SCREW FIXATION 1 LEVEL L4-5 (Spine Lumbar)     Patient location during evaluation: PACU Anesthesia Type: General Level of consciousness: awake and alert Pain management: pain level controlled Vital Signs Assessment: post-procedure vital signs reviewed and stable Respiratory status: spontaneous breathing, nonlabored ventilation, respiratory function stable and patient connected to nasal cannula oxygen Cardiovascular status: blood pressure returned to baseline and stable Postop Assessment: no apparent nausea or vomiting Anesthetic complications: no   No notable events documented.  Last Vitals:  Vitals:   04/06/21 2309 04/07/21 0439  BP: 128/68 126/66  Pulse: 97 (!) 106  Resp: 18 18  Temp: 36.9 C 36.8 C  SpO2: 95% 96%    Last Pain:  Vitals:   04/07/21 0647  TempSrc:   PainSc: [place]

## 2021-04-07 NOTE — Evaluation (Signed)
Occupational Therapy Evaluation Patient Details Name: Penny Morgan MRN: [id number] DOB: Feb 08, 1946 Today's Date: 04/07/2021    History of Present Illness 75 y.o. female presenting for elective L4-5 TLIF on 9/7 secondary to degenerative spondylolisthesis and L facet cyst. PMHx significant for lumbago, L UKA 07/2012, anxiety/depression, OA, CKD III, Hx of melanoma and HTN.   Clinical Impression   PTA patient was living with her spouse in a 2-level private residence with bedroom/bathroom on main level and was independent with ADLs/IADLs without AD. Patient currently presents at baseline level of function demonstrating observed ADLs and short distance functional mobility in room with I to distant supervision A respectively. OT provided education on spinal precautions, home set-up to maximize safety and independence with self-care tasks, and acquisition/use of AE. Patient expressed verbal understanding. Patient does not require continued acute occupational therapy services with OT to sign off at this time.      Follow Up Recommendations  No OT follow up    Equipment Recommendations  None recommended by OT    Recommendations for Other Services       Precautions / Restrictions Precautions Precautions: Back Precaution Booklet Issued: Yes (comment) Precaution Comments: Written and verbal education provided. Good recall during ADLs. Required Braces or Orthoses: Spinal Brace Spinal Brace: Applied in standing position Restrictions Weight Bearing Restrictions: No      Mobility Bed Mobility Overal bed mobility: Modified Independent                  Transfers Overall transfer level: Independent                    Balance Overall balance assessment: Mild deficits observed, not formally tested                                         ADL either performed or assessed with clinical judgement   ADL Overall ADL's : Modified independent                                              Vision   Vision Assessment?: No apparent visual deficits     Perception     Praxis      Pertinent Vitals/Pain Pain Assessment: 0-10 Pain Score: 2  Pain Location: Low back (incisional) Pain Descriptors / Indicators: Sore Pain Intervention(s): Limited activity within patient's tolerance;Monitored during session;Premedicated before session;Repositioned     Hand Dominance Right   Extremity/Trunk Assessment Upper Extremity Assessment Upper Extremity Assessment: Overall WFL for tasks assessed   Lower Extremity Assessment Lower Extremity Assessment: Overall WFL for tasks assessed   Cervical / Trunk Assessment Cervical / Trunk Assessment: Other exceptions Cervical / Trunk Exceptions: s/p lumbar surgery   Communication Communication Communication: No difficulties   Cognition Arousal/Alertness: Awake/alert Behavior During Therapy: WFL for tasks assessed/performed Overall Cognitive Status: Within Functional Limits for tasks assessed                                     General Comments  Clean/dry dressing at incision    Exercises     Shoulder Instructions      Home Living Family/patient expects to be discharged to:: Private residence Living Arrangements: Spouse/significant  other Available Help at Discharge: Family Type of Home: House Home Access: Stairs to enter [employer] of Steps: 1+1 Entrance Stairs-Rails: None Home Layout: Two level;Other (Comment) (Bedroom/bathroom on main level.)     Bathroom Shower/Tub: Walk-in shower;Tub only   Bathroom Toilet: Handicapped height     Home Equipment: Shower seat;Wheelchair - manual;Crutches;Grab bars - tub/shower;Adaptive equipment Adaptive Equipment: Reacher        Prior Functioning/Environment Level of Independence: Independent        Comments: I with ADLs/IADLs; drives; shared responsibility with husband for cooking/cleaning. Enjoys  gardening.        OT Problem List:        OT Treatment/Interventions:      OT Goals(Current goals can be found in the care plan section) Acute Rehab OT Goals Patient Stated Goal: To return home. OT Goal Formulation: With patient  OT Frequency:     Barriers to D/C:            Co-evaluation              AM-PAC OT "6 Clicks" Daily Activity     Outcome Measure Help from another person eating meals?: None Help from another person taking care of personal grooming?: None Help from another person toileting, which includes using toliet, bedpan, or urinal?: None Help from another person bathing (including washing, rinsing, drying)?: None Help from another person to put on and taking off regular upper body clothing?: None Help from another person to put on and taking off regular lower body clothing?: None 6 Click Score: 24   End of Session Equipment Utilized During Treatment: Back brace Nurse Communication: Mobility status  Activity Tolerance: Patient tolerated treatment well Patient left: in chair;with call bell/phone within reach  OT Visit Diagnosis: Unsteadiness on feet (R26.81)                [id number] OT Time Calculation (min): 18 min Charges:  OT General Charges $OT Visit: 1 Visit OT Evaluation $OT Eval Low Complexity: 1 Low  Byrd Terrero H. OTR/L Supplemental OT, Department of rehab services [phone]  Trisha Morandi R H. 04/07/2021, 8:26 AM

## 2021-04-07 NOTE — Plan of Care (Signed)
Pt doing well. Pt given D/C instructions with verbal understanding. Rx's were given to the Pt. Pt's incision is clean and dry with no sign of infection. Pt's IV was removed prior to D/C. Pt D/C'd home via walking per MD order. Pt is stable @ D/C and has no other needs at this time. Holli Humbles, RN

## 2021-04-07 NOTE — Evaluation (Signed)
Physical Therapy Evaluation and Discharge Patient Details Name: Penny Morgan MRN: [id number] DOB: 14-Nov-1945 Today's Date: 04/07/2021   History of Present Illness  75 y.o. female presenting for elective L4-5 TLIF on 9/7 secondary to degenerative spondylolisthesis and L facet cyst. PMHx significant for lumbago, L UKA 07/2012, anxiety/depression, OA, CKD III, Hx of melanoma and HTN.   Clinical Impression  Patient evaluated by Physical Therapy with no further acute PT needs identified. All education has been completed and the patient has no further questions. Pt was able to demonstrate transfers and ambulation with gross modified independence and no AD. Pt was educated on precautions, brace application/wearing schedule, appropriate activity progression, and car transfer. See below for any follow-up Physical Therapy or equipment needs. PT is signing off. Thank you for this referral.     Follow Up Recommendations No PT follow up;Supervision - Intermittent    Equipment Recommendations  None recommended by PT    Recommendations for Other Services       Precautions / Restrictions Precautions Precautions: Back Precaution Booklet Issued: Yes (comment) Precaution Comments: Reviewed handout and pt was cued for precautions during functional mobility. Required Braces or Orthoses: Spinal Brace Spinal Brace: Lumbar corset;Applied in sitting position Restrictions Weight Bearing Restrictions: No      Mobility  Bed Mobility Overal bed mobility: Modified Independent             General bed mobility comments: Reviewed log roll verbally.    Transfers Overall transfer level: Modified independent Equipment used: None             General transfer comment: VC's for improved posture upon stand. No assist required.  Ambulation/Gait Ambulation/Gait assistance: Modified independent (Device/Increase time) Gait Distance (Feet): 400 Feet Assistive device: None Gait Pattern/deviations:  Step-through pattern;Decreased stride length;Drifts right/left Gait velocity: Decreased Gait velocity interpretation: 1.31 - 2.62 ft/sec, indicative of limited community ambulator General Gait Details: Pt reaching out for external support for comfort, but able to ambulate without UE support without LOB.  Stairs Stairs: Yes Stairs assistance: Supervision Stair Management: One rail Right;Step to pattern;Forwards Number of Stairs: 10 General stair comments: VC's for sequencing and general safety. No unsteadiness or knee buckling noted.  Wheelchair Mobility    Modified Rankin (Stroke Patients Only)       Balance Overall balance assessment: Mild deficits observed, not formally tested                                           Pertinent Vitals/Pain Pain Assessment: Faces Pain Score: 2  Faces Pain Scale: Hurts a little bit Pain Location: Low back (incisional) Pain Descriptors / Indicators: Sore Pain Intervention(s): Limited activity within patient's tolerance;Monitored during session;Premedicated before session;Repositioned    Home Living Family/patient expects to be discharged to:: Private residence Living Arrangements: Spouse/significant other Available Help at Discharge: Family Type of Home: House Home Access: Stairs to enter Entrance Stairs-Rails: None Entrance Stairs-Number of Steps: 1+1 Home Layout: Two level;Other (Comment) (Bedroom/bathroom on main level.) Home Equipment: Shower seat;Wheelchair - manual;Crutches;Grab bars - tub/shower;Adaptive equipment      Prior Function Level of Independence: Independent         Comments: I with ADLs/IADLs; drives; shared responsibility with husband for cooking/cleaning. Enjoys gardening.     Hand Dominance   Dominant Hand: Right    Extremity/Trunk Assessment   Upper Extremity Assessment Upper Extremity Assessment: Defer to OT evaluation  Lower Extremity Assessment Lower Extremity Assessment:  Overall WFL for tasks assessed (Mild weakness consistent with pre-op diagnosis.)    Cervical / Trunk Assessment Cervical / Trunk Assessment: Other exceptions Cervical / Trunk Exceptions: s/p lumbar surgery  Communication   Communication: No difficulties  Cognition Arousal/Alertness: Awake/alert Behavior During Therapy: WFL for tasks assessed/performed Overall Cognitive Status: Within Functional Limits for tasks assessed                                        General Comments General comments (skin integrity, edema, etc.): Clean/dry dressing at incision    Exercises     Assessment/Plan    PT Assessment Patent does not need any further PT services  PT Problem List         PT Treatment Interventions      PT Goals (Current goals can be found in the Care Plan section)  Acute Rehab PT Goals Patient Stated Goal: To return home. PT Goal Formulation: All assessment and education complete, DC therapy    Frequency     Barriers to discharge        Co-evaluation               AM-PAC PT "6 Clicks" Mobility  Outcome Measure Help needed turning from your back to your side while in a flat bed without using bedrails?: None Help needed moving from lying on your back to sitting on the side of a flat bed without using bedrails?: None Help needed moving to and from a bed to a chair (including a wheelchair)?: None Help needed standing up from a chair using your arms (e.g., wheelchair or bedside chair)?: None Help needed to walk in hospital room?: None Help needed climbing 3-5 steps with a railing? : None 6 Click Score: 24    End of Session Equipment Utilized During Treatment: Gait belt Activity Tolerance: Patient tolerated treatment well Patient left: in chair;with call bell/phone within reach Nurse Communication: Mobility status PT Visit Diagnosis: Unsteadiness on feet (R26.81);Pain Pain - part of body:  (back)    Time: [id number] PT Time Calculation (min)  (ACUTE ONLY): 14 min   Charges:   PT Evaluation $PT Eval Low Complexity: 1 Low          Penny Morgan, PT, DPT Acute Rehabilitation Services Pager: [phone] Office: [phone]   Penny Morgan 04/07/2021, 11:45 AM

## 2021-04-08 LAB — SURGICAL PATHOLOGY

## 2021-04-08 NOTE — Discharge Summary (Addendum)
Patient ID: Penny Morgan MRN: [id number] DOB/AGE: 1946/01/26 75 y.o.  Admit date: 04/06/2021 Discharge date: 04/07/2021  Admission Diagnoses:  Active Problems:   S/P lumbar fusion   Discharge Diagnoses:  Active Problems:   S/P lumbar fusion  status post Procedure(s): TRANSFORAMINAL LUMBAR INTERBODY FUSION (TLIF) WITH PEDICLE SCREW FIXATION 1 LEVEL L4-5  Past Medical History:  Diagnosis Date   Anxiety    Arthritis    Atypical nevus 09/14/1998   right mid back - mild, Left side-slight   Atypical nevus 06/06/2002   left flank-slight/moderate RE CK 71MO   Atypical nevus 12/04/2002   right abdomen-moderate TX W/S   Atypical nevus 01/12/2011   left breast moderate/severe TX W/S   Atypical nevus 07/03/2014   rt side-mild, left thigh-mild   Atypical nevus 09/11/2017   Left shin-atypial junction MOHS EXC   Atypical nevus 01/12/2011   lower right side (moderate)   Atypical nevus 01/12/2011   rigth lower back (mod/severe) tx w/s   Carpal tunnel syndrome    Chest congestion    CHRONIC   Chronic kidney disease    STAGE III KIDNEY DISEASE   Clark level II melanoma ([place]) 06/22/2010   mid upper back REF. DR Clovis Riley   Contact lens/glasses fitting    contacts or glasses   Depression    Difficulty sleeping    GERD (gastroesophageal reflux disease)    Headache(784.0)    MIGRAINES   Hypertension    TAKES BP MED "FOR KIDNEYS" HAS HAD ELEVATED BP IN GTHE PAST   Hypothyroidism 1977   pt states she had to have radioactive iodine   Melanoma ([place]) 2 YRS AGO    Surgeries: Procedure(s): TRANSFORAMINAL LUMBAR INTERBODY FUSION (TLIF) WITH PEDICLE SCREW FIXATION 1 LEVEL L4-5 on 04/06/2021   Consultants:   Discharged Condition: Improved  Hospital Course: Penny Morgan is an 75 y.o. female who was admitted 04/06/2021 for operative treatment of Degenerative spondylolisthesis L4-5 with a left facet cyst causing radiculopathy in the L4 and L5 dermatome. Patient failed conservative  treatments (please see the history and physical for the specifics) and had severe unremitting pain that affects sleep, daily activities and work/hobbies. After pre-op clearance, the patient was taken to the operating room on 04/06/2021 and underwent  Procedure(s): TRANSFORAMINAL LUMBAR INTERBODY FUSION (TLIF) WITH PEDICLE SCREW FIXATION 1 LEVEL L4-5.    Patient was given perioperative antibiotics:  Anti-infectives (From admission, onward)    Start     Dose/Rate Route Frequency Ordered Stop   04/06/21 2130  ceFAZolin (ANCEF) IVPB 1 g/50 mL premix        1 g 100 mL/hr over 30 Minutes Intravenous Every 8 hours 04/06/21 2036 04/07/21 0501   04/06/21 1239  ceFAZolin (ANCEF) IVPB 2g/100 mL premix        2 g 200 mL/hr over 30 Minutes Intravenous 30 min pre-op 04/06/21 1239 04/06/21 1415        Patient was given sequential compression devices and early ambulation to prevent DVT.   Patient benefited maximally from hospital stay and there were no complications. At the time of discharge, the patient was urinating/moving their bowels without difficulty, tolerating a regular diet, pain is controlled with oral pain medications and they have been cleared by PT/OT.   Recent vital signs: No data found.   Recent laboratory studies: No results for input(s): WBC, HGB, HCT, PLT, NA, K, CL, CO2, BUN, CREATININE, GLUCOSE, INR, CALCIUM in the last 72 hours.  Invalid input(s): PT, 2   Discharge  Medications:   Allergies as of 04/07/2021       Reactions   Chocolate Flavor Other (See Comments)   Migraine        Medication List     STOP taking these medications    aspirin EC 81 MG tablet   cefPROZIL 250 MG tablet Commonly known as: CEFZIL   LORazepam 0.5 MG tablet Commonly known as: ATIVAN   predniSONE 20 MG tablet Commonly known as: DELTASONE   traMADol 50 MG tablet Commonly known as: ULTRAM   triamcinolone cream 0.1 % Commonly known as: KENALOG   zolpidem 10 MG tablet Commonly known as:  AMBIEN       TAKE these medications    albuterol 108 (90 Base) MCG/ACT inhaler Commonly known as: VENTOLIN HFA Inhale 2 puffs into the lungs every 6 (six) hours as needed for wheezing or shortness of breath.   furosemide 20 MG tablet Commonly known as: LASIX Take 20 mg by mouth daily as needed for fluid.   levothyroxine 100 MCG tablet Commonly known as: SYNTHROID Take 100 mcg by mouth every evening.   losartan 50 MG tablet Commonly known as: COZAAR Take 50 mg by mouth daily.   methocarbamol 500 MG tablet Commonly known as: Robaxin Take 1 tablet (500 mg total) by mouth every 8 (eight) hours as needed for up to 5 days for muscle spasms.   montelukast 10 MG tablet Commonly known as: SINGULAIR Take 10 mg by mouth daily.   multivitamin with minerals Tabs tablet Take 1 tablet by mouth daily.   omeprazole 20 MG capsule Commonly known as: PRILOSEC Take 20 mg by mouth daily.   ondansetron 4 MG tablet Commonly known as: Zofran Take 1 tablet (4 mg total) by mouth every 8 (eight) hours as needed for nausea or vomiting.   oxyCODONE-acetaminophen 10-325 MG tablet Commonly known as: Percocet Take 1 tablet by mouth every 6 (six) hours as needed for up to 5 days for pain.   Potassium 99 MG Tabs Take 99 mg by mouth daily.   rizatriptan 10 MG disintegrating tablet Commonly known as: MAXALT-MLT Take 10 mg by mouth every 2 (two) hours as needed for migraine (2 doses/24 hrs.). May repeat in 2 hours if needed for migraine   rosuvastatin 5 MG tablet Commonly known as: CRESTOR Take 5 mg by mouth 2 (two) times a week.   venlafaxine XR 75 MG 24 hr capsule Commonly known as: EFFEXOR-XR Take 75 mg by mouth daily with breakfast.        Diagnostic Studies: DG Lumbar Spine 2-3 Views  Result Date: 04/06/2021 CLINICAL DATA:  TLIF EXAM: LUMBAR SPINE - 2-3 VIEW COMPARISON:  None. FINDINGS: Three low resolution intraoperative spot views of the lumbar spine. Total fluoroscopy time was 3  minutes 22 seconds. The images demonstrate posterior rods and fixating screws at L4-L5 with interbody device. IMPRESSION: Intraoperative fluoroscopic assistance provided during lumbar spine surgery Electronically Signed   By: Donavan Foil M.D.   On: 04/06/2021 19:54   DG C-Arm 1-60 Min-No Report  Result Date: 04/06/2021 Fluoroscopy was utilized by the requesting physician.  No radiographic interpretation.    Discharge Instructions     Incentive spirometry RT   Complete by: As directed         Follow-up Information     Melina Schools, MD. Call in 2 week(s).   Specialty: Orthopedic Surgery Why: If symptoms worsen, For suture removal, For wound re-check Contact information: [address] [place] [address] [phone]  Discharge Plan:  discharge to home  Disposition: stable    Signed: Charlyne Petrin for [facility] PA-C [facility] [phone] 04/08/2021, 3:38 PM

## 2021-04-13 ENCOUNTER — Encounter (HOSPITAL_COMMUNITY): Payer: Self-pay | Admitting: Orthopedic Surgery

## 2021-05-17 DIAGNOSIS — Z4889 Encounter for other specified surgical aftercare: Secondary | ICD-10-CM | POA: Diagnosis not present

## 2021-05-18 DIAGNOSIS — M6281 Muscle weakness (generalized): Secondary | ICD-10-CM | POA: Diagnosis not present

## 2021-05-18 DIAGNOSIS — Z4889 Encounter for other specified surgical aftercare: Secondary | ICD-10-CM | POA: Diagnosis not present

## 2021-05-23 DIAGNOSIS — M6281 Muscle weakness (generalized): Secondary | ICD-10-CM | POA: Diagnosis not present

## 2021-05-23 DIAGNOSIS — Z4889 Encounter for other specified surgical aftercare: Secondary | ICD-10-CM | POA: Diagnosis not present

## 2021-05-25 DIAGNOSIS — M6281 Muscle weakness (generalized): Secondary | ICD-10-CM | POA: Diagnosis not present

## 2021-05-25 DIAGNOSIS — Z4889 Encounter for other specified surgical aftercare: Secondary | ICD-10-CM | POA: Diagnosis not present

## 2021-06-01 DIAGNOSIS — Z4889 Encounter for other specified surgical aftercare: Secondary | ICD-10-CM | POA: Diagnosis not present

## 2021-06-01 DIAGNOSIS — M6281 Muscle weakness (generalized): Secondary | ICD-10-CM | POA: Diagnosis not present

## 2021-06-07 DIAGNOSIS — N390 Urinary tract infection, site not specified: Secondary | ICD-10-CM | POA: Diagnosis not present

## 2021-06-20 DIAGNOSIS — Z1231 Encounter for screening mammogram for malignant neoplasm of breast: Secondary | ICD-10-CM | POA: Diagnosis not present

## 2021-06-27 DIAGNOSIS — Z4889 Encounter for other specified surgical aftercare: Secondary | ICD-10-CM | POA: Diagnosis not present

## 2021-07-07 DIAGNOSIS — M8589 Other specified disorders of bone density and structure, multiple sites: Secondary | ICD-10-CM | POA: Diagnosis not present

## 2021-07-07 DIAGNOSIS — M810 Age-related osteoporosis without current pathological fracture: Secondary | ICD-10-CM | POA: Diagnosis not present

## 2021-07-11 DIAGNOSIS — R739 Hyperglycemia, unspecified: Secondary | ICD-10-CM | POA: Diagnosis not present

## 2021-07-11 DIAGNOSIS — N189 Chronic kidney disease, unspecified: Secondary | ICD-10-CM | POA: Diagnosis not present

## 2021-07-11 DIAGNOSIS — E039 Hypothyroidism, unspecified: Secondary | ICD-10-CM | POA: Diagnosis not present

## 2021-07-11 DIAGNOSIS — K219 Gastro-esophageal reflux disease without esophagitis: Secondary | ICD-10-CM | POA: Diagnosis not present

## 2021-07-11 DIAGNOSIS — E7800 Pure hypercholesterolemia, unspecified: Secondary | ICD-10-CM | POA: Diagnosis not present

## 2021-07-11 DIAGNOSIS — E119 Type 2 diabetes mellitus without complications: Secondary | ICD-10-CM | POA: Diagnosis not present

## 2021-07-11 DIAGNOSIS — E7801 Familial hypercholesterolemia: Secondary | ICD-10-CM | POA: Diagnosis not present

## 2021-07-13 DIAGNOSIS — Z1389 Encounter for screening for other disorder: Secondary | ICD-10-CM | POA: Diagnosis not present

## 2021-07-13 DIAGNOSIS — Z1331 Encounter for screening for depression: Secondary | ICD-10-CM | POA: Diagnosis not present

## 2021-07-13 DIAGNOSIS — I10 Essential (primary) hypertension: Secondary | ICD-10-CM | POA: Diagnosis not present

## 2021-07-13 DIAGNOSIS — N189 Chronic kidney disease, unspecified: Secondary | ICD-10-CM | POA: Diagnosis not present

## 2021-07-13 DIAGNOSIS — E039 Hypothyroidism, unspecified: Secondary | ICD-10-CM | POA: Diagnosis not present

## 2021-07-13 DIAGNOSIS — Z0001 Encounter for general adult medical examination with abnormal findings: Secondary | ICD-10-CM | POA: Diagnosis not present

## 2021-07-13 DIAGNOSIS — E7849 Other hyperlipidemia: Secondary | ICD-10-CM | POA: Diagnosis not present

## 2021-07-13 DIAGNOSIS — R739 Hyperglycemia, unspecified: Secondary | ICD-10-CM | POA: Diagnosis not present

## 2021-08-31 DIAGNOSIS — N3000 Acute cystitis without hematuria: Secondary | ICD-10-CM | POA: Diagnosis not present

## 2021-08-31 DIAGNOSIS — R1084 Generalized abdominal pain: Secondary | ICD-10-CM | POA: Diagnosis not present

## 2021-08-31 DIAGNOSIS — K5900 Constipation, unspecified: Secondary | ICD-10-CM | POA: Diagnosis not present

## 2021-08-31 DIAGNOSIS — R350 Frequency of micturition: Secondary | ICD-10-CM | POA: Diagnosis not present

## 2021-09-22 DIAGNOSIS — Z961 Presence of intraocular lens: Secondary | ICD-10-CM | POA: Diagnosis not present

## 2021-11-09 ENCOUNTER — Ambulatory Visit: Payer: Medicare PPO | Admitting: Physician Assistant

## 2021-11-09 ENCOUNTER — Encounter: Payer: Self-pay | Admitting: Physician Assistant

## 2021-11-09 DIAGNOSIS — Z85820 Personal history of malignant melanoma of skin: Secondary | ICD-10-CM

## 2021-11-09 DIAGNOSIS — R739 Hyperglycemia, unspecified: Secondary | ICD-10-CM | POA: Diagnosis not present

## 2021-11-09 DIAGNOSIS — E782 Mixed hyperlipidemia: Secondary | ICD-10-CM | POA: Diagnosis not present

## 2021-11-09 DIAGNOSIS — E039 Hypothyroidism, unspecified: Secondary | ICD-10-CM | POA: Diagnosis not present

## 2021-11-09 DIAGNOSIS — K219 Gastro-esophageal reflux disease without esophagitis: Secondary | ICD-10-CM | POA: Diagnosis not present

## 2021-11-09 DIAGNOSIS — Z1283 Encounter for screening for malignant neoplasm of skin: Secondary | ICD-10-CM | POA: Diagnosis not present

## 2021-11-09 DIAGNOSIS — E7849 Other hyperlipidemia: Secondary | ICD-10-CM | POA: Diagnosis not present

## 2021-11-09 DIAGNOSIS — E559 Vitamin D deficiency, unspecified: Secondary | ICD-10-CM | POA: Diagnosis not present

## 2021-11-09 DIAGNOSIS — Z86018 Personal history of other benign neoplasm: Secondary | ICD-10-CM

## 2021-11-09 DIAGNOSIS — I10 Essential (primary) hypertension: Secondary | ICD-10-CM | POA: Diagnosis not present

## 2021-11-09 DIAGNOSIS — E119 Type 2 diabetes mellitus without complications: Secondary | ICD-10-CM | POA: Diagnosis not present

## 2021-11-09 DIAGNOSIS — N189 Chronic kidney disease, unspecified: Secondary | ICD-10-CM | POA: Diagnosis not present

## 2021-11-09 NOTE — Progress Notes (Signed)
? ?  Follow-Up Visit ?  ?Subjective  ?Penny Morgan is a 76 y.o. female who presents for the following: Annual Exam (Here for annual skin exam. No concerns. History of melanoma. History of atypical moles. ). ? ? ?The following portions of the chart were reviewed this encounter and updated as appropriate:  Tobacco  Allergies  Meds  Problems  Med Hx  Surg Hx  Fam Hx   ?  ? ?Objective  ?Well appearing patient in no apparent distress; mood and affect are within normal limits. ? ?A full examination was performed including scalp, head, eyes, ears, nose, lips, neck, chest, axillae, abdomen, back, buttocks, bilateral upper extremities, bilateral lower extremities, hands, feet, fingers, toes, fingernails, and toenails. All findings within normal limits unless otherwise noted below. ? ?Full body skin examination-No atypical nevi or signs of NMSC noted at the time of the visit. All scars were clear.  ? ?Mid upper back ?White scar- clear ? ? ?Assessment & Plan  ?Encounter for screening for malignant neoplasm of skin ? ?Re check in 6 months. ? ?Personal history of malignant melanoma of skin ?Mid upper back ? ?Yearly skin examination  ? ? ? ?I, Terren Jandreau, PA-C, have reviewed all documentation's for this visit.  The documentation on 11/09/21 for the exam, diagnosis, procedures and orders are all accurate and complete. ?

## 2021-11-14 DIAGNOSIS — Z4889 Encounter for other specified surgical aftercare: Secondary | ICD-10-CM | POA: Diagnosis not present

## 2021-11-15 DIAGNOSIS — N189 Chronic kidney disease, unspecified: Secondary | ICD-10-CM | POA: Diagnosis not present

## 2021-11-15 DIAGNOSIS — Z6829 Body mass index (BMI) 29.0-29.9, adult: Secondary | ICD-10-CM | POA: Diagnosis not present

## 2021-11-15 DIAGNOSIS — I10 Essential (primary) hypertension: Secondary | ICD-10-CM | POA: Diagnosis not present

## 2021-11-15 DIAGNOSIS — R739 Hyperglycemia, unspecified: Secondary | ICD-10-CM | POA: Diagnosis not present

## 2021-11-15 DIAGNOSIS — E039 Hypothyroidism, unspecified: Secondary | ICD-10-CM | POA: Diagnosis not present

## 2021-11-15 DIAGNOSIS — F419 Anxiety disorder, unspecified: Secondary | ICD-10-CM | POA: Diagnosis not present

## 2021-11-15 DIAGNOSIS — E7849 Other hyperlipidemia: Secondary | ICD-10-CM | POA: Diagnosis not present

## 2021-11-15 DIAGNOSIS — M2550 Pain in unspecified joint: Secondary | ICD-10-CM | POA: Diagnosis not present

## 2022-03-01 DIAGNOSIS — R5383 Other fatigue: Secondary | ICD-10-CM | POA: Diagnosis not present

## 2022-03-01 DIAGNOSIS — E119 Type 2 diabetes mellitus without complications: Secondary | ICD-10-CM | POA: Diagnosis not present

## 2022-03-01 DIAGNOSIS — K219 Gastro-esophageal reflux disease without esophagitis: Secondary | ICD-10-CM | POA: Diagnosis not present

## 2022-03-01 DIAGNOSIS — N189 Chronic kidney disease, unspecified: Secondary | ICD-10-CM | POA: Diagnosis not present

## 2022-03-01 DIAGNOSIS — E039 Hypothyroidism, unspecified: Secondary | ICD-10-CM | POA: Diagnosis not present

## 2022-03-01 DIAGNOSIS — E7849 Other hyperlipidemia: Secondary | ICD-10-CM | POA: Diagnosis not present

## 2022-03-01 DIAGNOSIS — I10 Essential (primary) hypertension: Secondary | ICD-10-CM | POA: Diagnosis not present

## 2022-03-08 DIAGNOSIS — G4700 Insomnia, unspecified: Secondary | ICD-10-CM | POA: Diagnosis not present

## 2022-03-08 DIAGNOSIS — R739 Hyperglycemia, unspecified: Secondary | ICD-10-CM | POA: Diagnosis not present

## 2022-03-08 DIAGNOSIS — F419 Anxiety disorder, unspecified: Secondary | ICD-10-CM | POA: Diagnosis not present

## 2022-03-08 DIAGNOSIS — E7849 Other hyperlipidemia: Secondary | ICD-10-CM | POA: Diagnosis not present

## 2022-03-08 DIAGNOSIS — E039 Hypothyroidism, unspecified: Secondary | ICD-10-CM | POA: Diagnosis not present

## 2022-03-08 DIAGNOSIS — N189 Chronic kidney disease, unspecified: Secondary | ICD-10-CM | POA: Diagnosis not present

## 2022-03-08 DIAGNOSIS — M2550 Pain in unspecified joint: Secondary | ICD-10-CM | POA: Diagnosis not present

## 2022-03-08 DIAGNOSIS — I10 Essential (primary) hypertension: Secondary | ICD-10-CM | POA: Diagnosis not present

## 2022-03-08 DIAGNOSIS — K219 Gastro-esophageal reflux disease without esophagitis: Secondary | ICD-10-CM | POA: Diagnosis not present

## 2022-04-04 DIAGNOSIS — Z4889 Encounter for other specified surgical aftercare: Secondary | ICD-10-CM | POA: Diagnosis not present

## 2022-04-04 DIAGNOSIS — M25552 Pain in left hip: Secondary | ICD-10-CM | POA: Diagnosis not present

## 2022-04-26 DIAGNOSIS — R300 Dysuria: Secondary | ICD-10-CM | POA: Diagnosis not present

## 2022-04-26 DIAGNOSIS — Z23 Encounter for immunization: Secondary | ICD-10-CM | POA: Diagnosis not present

## 2022-04-26 DIAGNOSIS — Z6828 Body mass index (BMI) 28.0-28.9, adult: Secondary | ICD-10-CM | POA: Diagnosis not present

## 2022-05-05 DIAGNOSIS — N766 Ulceration of vulva: Secondary | ICD-10-CM | POA: Diagnosis not present

## 2022-05-05 DIAGNOSIS — Z1331 Encounter for screening for depression: Secondary | ICD-10-CM | POA: Diagnosis not present

## 2022-05-05 DIAGNOSIS — N761 Subacute and chronic vaginitis: Secondary | ICD-10-CM | POA: Diagnosis not present

## 2022-05-11 DIAGNOSIS — N760 Acute vaginitis: Secondary | ICD-10-CM | POA: Diagnosis not present

## 2022-06-26 DIAGNOSIS — Z1231 Encounter for screening mammogram for malignant neoplasm of breast: Secondary | ICD-10-CM | POA: Diagnosis not present

## 2022-06-30 DIAGNOSIS — E7849 Other hyperlipidemia: Secondary | ICD-10-CM | POA: Diagnosis not present

## 2022-06-30 DIAGNOSIS — E7801 Familial hypercholesterolemia: Secondary | ICD-10-CM | POA: Diagnosis not present

## 2022-06-30 DIAGNOSIS — N189 Chronic kidney disease, unspecified: Secondary | ICD-10-CM | POA: Diagnosis not present

## 2022-06-30 DIAGNOSIS — E039 Hypothyroidism, unspecified: Secondary | ICD-10-CM | POA: Diagnosis not present

## 2022-06-30 DIAGNOSIS — K219 Gastro-esophageal reflux disease without esophagitis: Secondary | ICD-10-CM | POA: Diagnosis not present

## 2022-06-30 DIAGNOSIS — E119 Type 2 diabetes mellitus without complications: Secondary | ICD-10-CM | POA: Diagnosis not present

## 2022-06-30 DIAGNOSIS — R739 Hyperglycemia, unspecified: Secondary | ICD-10-CM | POA: Diagnosis not present

## 2022-06-30 DIAGNOSIS — I10 Essential (primary) hypertension: Secondary | ICD-10-CM | POA: Diagnosis not present

## 2022-07-05 DIAGNOSIS — I10 Essential (primary) hypertension: Secondary | ICD-10-CM | POA: Diagnosis not present

## 2022-07-05 DIAGNOSIS — F419 Anxiety disorder, unspecified: Secondary | ICD-10-CM | POA: Diagnosis not present

## 2022-07-05 DIAGNOSIS — R739 Hyperglycemia, unspecified: Secondary | ICD-10-CM | POA: Diagnosis not present

## 2022-07-05 DIAGNOSIS — G4700 Insomnia, unspecified: Secondary | ICD-10-CM | POA: Diagnosis not present

## 2022-07-05 DIAGNOSIS — M2550 Pain in unspecified joint: Secondary | ICD-10-CM | POA: Diagnosis not present

## 2022-07-05 DIAGNOSIS — Z0001 Encounter for general adult medical examination with abnormal findings: Secondary | ICD-10-CM | POA: Diagnosis not present

## 2022-07-05 DIAGNOSIS — E039 Hypothyroidism, unspecified: Secondary | ICD-10-CM | POA: Diagnosis not present

## 2022-07-05 DIAGNOSIS — N189 Chronic kidney disease, unspecified: Secondary | ICD-10-CM | POA: Diagnosis not present

## 2022-07-05 DIAGNOSIS — K219 Gastro-esophageal reflux disease without esophagitis: Secondary | ICD-10-CM | POA: Diagnosis not present

## 2022-10-10 DIAGNOSIS — Z961 Presence of intraocular lens: Secondary | ICD-10-CM | POA: Diagnosis not present

## 2022-10-10 DIAGNOSIS — H04123 Dry eye syndrome of bilateral lacrimal glands: Secondary | ICD-10-CM | POA: Diagnosis not present

## 2022-11-02 DIAGNOSIS — H903 Sensorineural hearing loss, bilateral: Secondary | ICD-10-CM | POA: Diagnosis not present

## 2022-11-02 DIAGNOSIS — R42 Dizziness and giddiness: Secondary | ICD-10-CM | POA: Diagnosis not present

## 2022-11-02 DIAGNOSIS — H6121 Impacted cerumen, right ear: Secondary | ICD-10-CM | POA: Diagnosis not present

## 2022-11-02 DIAGNOSIS — H9313 Tinnitus, bilateral: Secondary | ICD-10-CM | POA: Diagnosis not present

## 2022-11-03 DIAGNOSIS — E7801 Familial hypercholesterolemia: Secondary | ICD-10-CM | POA: Diagnosis not present

## 2022-11-03 DIAGNOSIS — E119 Type 2 diabetes mellitus without complications: Secondary | ICD-10-CM | POA: Diagnosis not present

## 2022-11-03 DIAGNOSIS — E7849 Other hyperlipidemia: Secondary | ICD-10-CM | POA: Diagnosis not present

## 2022-11-03 DIAGNOSIS — N189 Chronic kidney disease, unspecified: Secondary | ICD-10-CM | POA: Diagnosis not present

## 2022-11-03 DIAGNOSIS — I10 Essential (primary) hypertension: Secondary | ICD-10-CM | POA: Diagnosis not present

## 2022-11-03 DIAGNOSIS — E785 Hyperlipidemia, unspecified: Secondary | ICD-10-CM | POA: Diagnosis not present

## 2022-11-08 DIAGNOSIS — G4700 Insomnia, unspecified: Secondary | ICD-10-CM | POA: Diagnosis not present

## 2022-11-08 DIAGNOSIS — E7849 Other hyperlipidemia: Secondary | ICD-10-CM | POA: Diagnosis not present

## 2022-11-08 DIAGNOSIS — F419 Anxiety disorder, unspecified: Secondary | ICD-10-CM | POA: Diagnosis not present

## 2022-11-08 DIAGNOSIS — R739 Hyperglycemia, unspecified: Secondary | ICD-10-CM | POA: Diagnosis not present

## 2022-11-08 DIAGNOSIS — I10 Essential (primary) hypertension: Secondary | ICD-10-CM | POA: Diagnosis not present

## 2022-11-08 DIAGNOSIS — M2550 Pain in unspecified joint: Secondary | ICD-10-CM | POA: Diagnosis not present

## 2022-11-08 DIAGNOSIS — N1831 Chronic kidney disease, stage 3a: Secondary | ICD-10-CM | POA: Diagnosis not present

## 2022-11-08 DIAGNOSIS — K219 Gastro-esophageal reflux disease without esophagitis: Secondary | ICD-10-CM | POA: Diagnosis not present

## 2022-11-08 DIAGNOSIS — E039 Hypothyroidism, unspecified: Secondary | ICD-10-CM | POA: Diagnosis not present

## 2022-11-10 DIAGNOSIS — R222 Localized swelling, mass and lump, trunk: Secondary | ICD-10-CM | POA: Diagnosis not present

## 2022-11-14 ENCOUNTER — Ambulatory Visit: Payer: Medicare PPO | Admitting: Physician Assistant

## 2022-11-14 DIAGNOSIS — L568 Other specified acute skin changes due to ultraviolet radiation: Secondary | ICD-10-CM | POA: Diagnosis not present

## 2022-11-14 DIAGNOSIS — D3617 Benign neoplasm of peripheral nerves and autonomic nervous system of trunk, unspecified: Secondary | ICD-10-CM | POA: Diagnosis not present

## 2022-11-14 DIAGNOSIS — D485 Neoplasm of uncertain behavior of skin: Secondary | ICD-10-CM | POA: Diagnosis not present

## 2022-11-14 DIAGNOSIS — D225 Melanocytic nevi of trunk: Secondary | ICD-10-CM | POA: Diagnosis not present

## 2022-11-14 DIAGNOSIS — Z08 Encounter for follow-up examination after completed treatment for malignant neoplasm: Secondary | ICD-10-CM | POA: Diagnosis not present

## 2022-11-14 DIAGNOSIS — Z85820 Personal history of malignant melanoma of skin: Secondary | ICD-10-CM | POA: Diagnosis not present

## 2022-11-14 DIAGNOSIS — Z1283 Encounter for screening for malignant neoplasm of skin: Secondary | ICD-10-CM | POA: Diagnosis not present

## 2022-11-14 DIAGNOSIS — D1801 Hemangioma of skin and subcutaneous tissue: Secondary | ICD-10-CM | POA: Diagnosis not present

## 2022-11-14 DIAGNOSIS — L821 Other seborrheic keratosis: Secondary | ICD-10-CM | POA: Diagnosis not present

## 2022-11-15 DIAGNOSIS — R222 Localized swelling, mass and lump, trunk: Secondary | ICD-10-CM | POA: Diagnosis not present

## 2022-11-21 DIAGNOSIS — R42 Dizziness and giddiness: Secondary | ICD-10-CM | POA: Diagnosis not present

## 2022-12-07 DIAGNOSIS — R42 Dizziness and giddiness: Secondary | ICD-10-CM | POA: Diagnosis not present

## 2022-12-07 DIAGNOSIS — H903 Sensorineural hearing loss, bilateral: Secondary | ICD-10-CM | POA: Diagnosis not present

## 2022-12-07 DIAGNOSIS — H9313 Tinnitus, bilateral: Secondary | ICD-10-CM | POA: Diagnosis not present

## 2022-12-18 DIAGNOSIS — R222 Localized swelling, mass and lump, trunk: Secondary | ICD-10-CM | POA: Diagnosis not present

## 2022-12-19 DIAGNOSIS — E039 Hypothyroidism, unspecified: Secondary | ICD-10-CM | POA: Diagnosis not present

## 2022-12-19 DIAGNOSIS — Z7982 Long term (current) use of aspirin: Secondary | ICD-10-CM | POA: Diagnosis not present

## 2022-12-19 DIAGNOSIS — R222 Localized swelling, mass and lump, trunk: Secondary | ICD-10-CM | POA: Diagnosis not present

## 2022-12-19 DIAGNOSIS — Z79890 Hormone replacement therapy (postmenopausal): Secondary | ICD-10-CM | POA: Diagnosis not present

## 2022-12-19 DIAGNOSIS — Z85828 Personal history of other malignant neoplasm of skin: Secondary | ICD-10-CM | POA: Diagnosis not present

## 2022-12-19 DIAGNOSIS — G43909 Migraine, unspecified, not intractable, without status migrainosus: Secondary | ICD-10-CM | POA: Diagnosis not present

## 2022-12-19 DIAGNOSIS — I10 Essential (primary) hypertension: Secondary | ICD-10-CM | POA: Diagnosis not present

## 2022-12-19 DIAGNOSIS — K219 Gastro-esophageal reflux disease without esophagitis: Secondary | ICD-10-CM | POA: Diagnosis not present

## 2022-12-19 DIAGNOSIS — Z79899 Other long term (current) drug therapy: Secondary | ICD-10-CM | POA: Diagnosis not present

## 2022-12-19 DIAGNOSIS — D171 Benign lipomatous neoplasm of skin and subcutaneous tissue of trunk: Secondary | ICD-10-CM | POA: Diagnosis not present

## 2022-12-19 DIAGNOSIS — Z882 Allergy status to sulfonamides status: Secondary | ICD-10-CM | POA: Diagnosis not present

## 2022-12-20 DIAGNOSIS — G4733 Obstructive sleep apnea (adult) (pediatric): Secondary | ICD-10-CM | POA: Diagnosis not present

## 2023-01-17 ENCOUNTER — Ambulatory Visit (HOSPITAL_COMMUNITY): Payer: Medicare PPO

## 2023-02-20 DIAGNOSIS — G4733 Obstructive sleep apnea (adult) (pediatric): Secondary | ICD-10-CM | POA: Diagnosis not present

## 2023-02-27 DIAGNOSIS — M79671 Pain in right foot: Secondary | ICD-10-CM | POA: Diagnosis not present

## 2023-02-27 DIAGNOSIS — M25561 Pain in right knee: Secondary | ICD-10-CM | POA: Diagnosis not present

## 2023-02-28 ENCOUNTER — Ambulatory Visit (HOSPITAL_COMMUNITY): Payer: Medicare PPO | Admitting: Physical Therapy

## 2023-02-28 ENCOUNTER — Other Ambulatory Visit: Payer: Self-pay

## 2023-02-28 DIAGNOSIS — R296 Repeated falls: Secondary | ICD-10-CM | POA: Insufficient documentation

## 2023-02-28 DIAGNOSIS — R42 Dizziness and giddiness: Secondary | ICD-10-CM | POA: Insufficient documentation

## 2023-02-28 NOTE — Therapy (Addendum)
OUTPATIENT PHYSICAL THERAPY VESTIBULAR EVALUATION     Patient Name: Penny Morgan MRN: [id number] DOB:[birth year], 77 y.o., female Today's Date: 02/28/2023  END OF SESSION:  PT End of Session - 02/28/23 1152     Visit Number 1    Number of Visits 6    Date for PT Re-Evaluation 04/18/23    Authorization Type [employer]- auth put in    PT Start Time 1040    PT Stop Time 1130    PT Time Calculation (min) 50 min             Past Medical History:  Diagnosis Date   Anxiety    Arthritis    Atypical nevus 09/14/1998   right mid back - mild, Left side-slight   Atypical nevus 06/06/2002   left flank-slight/moderate RE CK   Atypical nevus 12/04/2002   right abdomen-moderate TX W/S   Atypical nevus 01/12/2011   left breast moderate/severe TX W/S   Atypical nevus 07/03/2014   rt side-mild, left thigh-mild   Atypical nevus 09/11/2017   Left shin-atypial junction MOHS EXC   Atypical nevus 01/12/2011   lower right side (moderate)   Atypical nevus 01/12/2011   rigth lower back (mod/severe) tx w/s   Carpal tunnel syndrome    Chest congestion    CHRONIC   Chronic kidney disease    STAGE III KIDNEY DISEASE   [facility]ark level II melanoma ([facility]) 06/22/2010   mid upper back REF. DR Lenis Noon   Contact lens/glasses fitting    contacts or glasses   Depression    Difficulty sleeping    GERD (gastroesophageal reflux disease)    Headache(784.0)    MIGRAINES   Hypertension    TAKES BP MED "FOR KIDNEYS" HAS HAD ELEVATED BP IN GTHE PAST   Hypothyroidism 1977   pt states she had to have radioactive iodine   Melanoma ([facility]) 2 YRS AGO   Past Surgical History:  Procedure Laterality Date   CARPAL TUNNEL RELEASE Right 12/05/2012   Procedure: CARPAL TUNNEL RELEASE;  Surgeon: Wyn Forster., MD;  Location: [facility];  Service: Orthopedics;  Laterality: Right;   CARPAL TUNNEL RELEASE Left 10/21/2013   Procedure: CARPAL TUNNEL RELEASE;  Surgeon: Wyn Forster., MD;   Location: [facility];  Service: Orthopedics;  Laterality: Left;   CARPOMETACARPEL SUSPENSION PLASTY Left 10/21/2013   Procedure: TRAPEZIECTOMY FLEXOR CARPI RADIALIS KNOT PLASTY;  Surgeon: Wyn Forster., MD;  Location: [facility];  Service: Orthopedics;  Laterality: Left;   COLONOSCOPY N/A 10/15/2018   Procedure: COLONOSCOPY;  Surgeon: Franky Macho, MD;  Location: [facility];  Service: Gastroenterology;  Laterality: N/A;   EYE SURGERY Bilateral 2018   pt states she had this done in [place]   KNEE ARTHROSCOPY     L KNEE   MELANOMA EXCISION  07/31/2009   back   PARTIAL KNEE ARTHROPLASTY  08/05/2012   Procedure: UNICOMPARTMENTAL KNEE;  Surgeon: Shelda Pal, MD;  Location: [facility];  Service: Orthopedics;  Laterality: Left;   TONSILLECTOMY     TRANSFORAMINAL LUMBAR INTERBODY FUSION (TLIF) WITH PEDICLE SCREW FIXATION 1 LEVEL N/A 04/06/2021   Procedure: TRANSFORAMINAL LUMBAR INTERBODY FUSION (TLIF) WITH PEDICLE SCREW FIXATION 1 LEVEL L4-5;  Surgeon: Venita Lick, MD;  Location: [facility];  Service: Orthopedics;  Laterality: N/A;  5 hrs 3C-Bed   TRIGGER FINGER RELEASE     L HAND   TRIGGER FINGER RELEASE Right 12/05/2012   Procedure: RELEASE TRIGGER  FINGER/A-1 PULLEY RIGHT RING FINGER;  Surgeon: Wyn Forster., MD;  Location: [facility];  Service: Orthopedics;  Laterality: Right;   TRIGGER FINGER RELEASE Left 10/21/2013   Procedure: LEFT RING  STENOSING TENOSYNOVITIS RELEASE AND  LEFT THUMB;  Surgeon: Wyn Forster., MD;  Location: [facility];  Service: Orthopedics;  Laterality: Left;   Patient Active Problem List   Diagnosis Date Noted   S/P lumbar fusion 04/06/2021   Positive colorectal cancer screening using Cologuard test    Diverticulosis of large intestine without diverticulitis    Hemorrhoids without complication    Expected blood loss anemia 08/06/2012   Obese 08/06/2012   S/P left UKA 08/05/2012    PCP:  Roxine Caddy REFERRING PROVIDER: Karle Barr, MD  REFERRING DIAG:  Free Text Diagnosis  dizziness    THERAPY DIAG:  Dizziness and giddiness - Plan: PT plan of care cert/re-cert  Repeated falls  ONSET DATE: over six months   Rationale for Evaluation and Treatment: Rehabilitation    SUBJECTIVE STATEMENT: PT states that she has had bouts of dizziness for over six months now.  She went to the eye doctor who cleared her eyes.  She then went to the ENT who tested her inner ear and stated that it was not her inner ear and wanted her to go for vestibular therapy.  PT states  that she fell once due to her dizziness in her yard and once due to her RT knee giving out.  PERTINENT HISTORY: Rt OA, prior back surgery, see above   PAIN: no PRECAUTIONS: Fall     WEIGHT BEARING RESTRICTIONS: No  FALLS: Has patient fallen in last 6 months? Yes. Number of falls 2    PLOF: Independent  PATIENT GOALS: to be less dizzy  OBJECTIVE:    COGNITION: Overall cognitive status: Within functional limits for tasks assessed POSTURE:  No Significant postural limitations  Cervical ROM:    Active A/PROM (deg) eval  Flexion   Extension   Right lateral flexion   Left lateral flexion   Right rotation 80 with pain  Left rotation 75  (Blank rows = not tested)    FUNCTIONAL TESTS:  Single leg stance  Rt 4", LT 4"  PATIENT SURVEYS:  FOTO 50  VESTIBULAR ASSESSMENT:    SYMPTOM BEHAVIOR:  Subjective history: Pt feels that if she is moving to the left she has more dizziness.  Pt states that she started drinking more water.   Non-Vestibular symptoms: neck pain, headaches, tinnitus, nausea/vomiting, and migraine symptoms  Type of dizziness: Spinning/Vertigo and heavy headedness  Frequency: was more frequent now it is occurring about once a week now.   Duration: 30 seconds   Aggravating factors: No known aggravating factors  Relieving factors: rest  Progression of symptoms:  better  OCULOMOTOR EXAM:  Ocular Alignment: normal  Ocular ROM: No Limitations  Spontaneous Nystagmus: absent  Gaze-Induced Nystagmus: left beating with right gaze  Smooth Pursuits: saccades; Lt mild;  RT   Saccades: intact      POSITIONAL TESTING: Right Dix-Hallpike: no nystagmus Left Dix-Hallpike: sx of dizziness   MOTION SENSITIVITY:  Motion Sensitivity Quotient Intensity: 0 = none, 1 = Lightheaded, 2 = Mild, 3 = Moderate, 4 = Severe, 5 = Vomiting  Intensity  1. Sitting to supine   2. Supine to L side   3. Supine to R side   4. Supine to sitting   5. L Hallpike-Dix 2  6. Up from  L  0  7. R Hallpike-Dix 0  8. Up from R  2    9. Sitting, head tipped to L knee   10. Head up from L knee   11. Sitting, head tipped to R knee   12. Head up from R knee   13. Sitting head turns x5   14.Sitting head nods x5   15. In stance, 180 turn to L    16. In stance, 180 turn to R      VESTIBULAR TREATMENT:                                                                                                   DATE: 02/28/23 Sitting in good posture hold 5" x 5 Scapular retraction x 10  Canalith Repositioning:  Epley Left: Number of Reps: 1 and Response to Treatment: symptoms improved Habituation:  Seated Horizontal Head Movements: number of reps: 5   PATIENT EDUCATION: Education details: HEP  Person educated: Patient Education method: Explanation and Handouts Education comprehension: verbalized understanding and returned demonstration  HOME EXERCISE PROGRAM: Access Code: [email or web address] URL: [email or web address] Date: 02/28/2023 Prepared by: Virgina Organ  Exercises - Seated Gaze Stabilization with Head Rotation  - 4 x daily - 7 x weekly - 1 sets - 10 reps - 5-10 hold - Correct Seated Posture  - 4 x daily - 7 x weekly - 1 sets - 5 reps - 30 seconds  hold - Seated Scapular Retraction  - 3 x daily - 7 x weekly - 1 sets - 10 reps - 3-5" hold GOALS: Goals reviewed with  patient? No  SHORT TERM GOALS: Target date: 03/21/23  Pt to be I in HEP in order to decrease her sx of dizziness by 50%  Baseline: Goal status: INITIAL  2.  Pt to be able to single leg stance for 10 " on both LE to reduce risk of falling.  Baseline:  Goal status: INITIAL   LONG TERM GOALS: Target date: 04/11/23  PT to be I in HEP in order to state that she has had no sx of dizziness in the past 4 weeks.  Baseline:  Goal status: INITIAL  2.  PT to be able to single leg stance for 15 seconds B to reduce risk of falling  Baseline:  Goal status: INITIAL   ASSESSMENT:  CLINICAL IMPRESSION: Patient is a 77 y.o. female who was seen today for physical therapy evaluation and treatment for dizziness. Evaluation demonstrates increased dizziness with LT head turn and pt verbally stating increased dizziness with LT dix-hallpike maneuver.  Pt has forward head, increased kyphosis and noted decreased balance with hx of falls.    OBJECTIVE IMPAIRMENTS: decreased balance and dizziness.   ACTIVITY LIMITATIONS: bending and squatting  PARTICIPATION LIMITATIONS:  pt is not allowing sx to limit her activity at this time.   REHAB POTENTIAL: Good  CLINICAL DECISION MAKING: Stable/uncomplicated  EVALUATION COMPLEXITY: Low   PLAN:  PT FREQUENCY: 1x/week  PT DURATION: 6 weeks  PLANNED INTERVENTIONS: Therapeutic exercises, Therapeutic activity, Neuromuscular re-education, Balance training, Patient/Family education, Self Care, and Manual therapy  PLAN FOR NEXT SESSION: continue positional testing for sx, manual to trap in case cervical-ocular reflex is affected.  Begin balance   Virgina Organ, PT CLT [phone]  02/28/2023, 11:54 AM

## 2023-02-28 NOTE — Addendum Note (Signed)
Addended by: Bella Kennedy on: 02/28/2023 12:00 PM   Modules accepted: Orders

## 2023-02-28 NOTE — Addendum Note (Signed)
Addended by: Bella Kennedy on: 02/28/2023 11:58 AM   Modules accepted: Orders

## 2023-03-05 ENCOUNTER — Ambulatory Visit (HOSPITAL_COMMUNITY): Payer: Medicare PPO

## 2023-03-14 ENCOUNTER — Ambulatory Visit (HOSPITAL_COMMUNITY): Payer: Medicare PPO

## 2023-03-14 DIAGNOSIS — R296 Repeated falls: Secondary | ICD-10-CM | POA: Diagnosis not present

## 2023-03-14 DIAGNOSIS — E7801 Familial hypercholesterolemia: Secondary | ICD-10-CM | POA: Diagnosis not present

## 2023-03-14 DIAGNOSIS — R42 Dizziness and giddiness: Secondary | ICD-10-CM | POA: Diagnosis not present

## 2023-03-14 DIAGNOSIS — E7800 Pure hypercholesterolemia, unspecified: Secondary | ICD-10-CM | POA: Diagnosis not present

## 2023-03-14 DIAGNOSIS — E782 Mixed hyperlipidemia: Secondary | ICD-10-CM | POA: Diagnosis not present

## 2023-03-14 DIAGNOSIS — E119 Type 2 diabetes mellitus without complications: Secondary | ICD-10-CM | POA: Diagnosis not present

## 2023-03-14 DIAGNOSIS — E7849 Other hyperlipidemia: Secondary | ICD-10-CM | POA: Diagnosis not present

## 2023-03-14 DIAGNOSIS — E039 Hypothyroidism, unspecified: Secondary | ICD-10-CM | POA: Diagnosis not present

## 2023-03-14 DIAGNOSIS — N1831 Chronic kidney disease, stage 3a: Secondary | ICD-10-CM | POA: Diagnosis not present

## 2023-03-14 NOTE — Therapy (Signed)
OUTPATIENT PHYSICAL THERAPY VESTIBULAR EVALUATION     Patient Name: Penny Morgan MRN: [id number] DOB:[birth year], 77 y.o., female Today's Date: 03/14/2023  END OF SESSION:  PT End of Session - 03/14/23 0916     Visit Number 2    Number of Visits 6    Date for PT Re-Evaluation 04/18/23    Authorization Type [employer]- auth put in    PT Start Time 0915   late check in   PT Stop Time 0945    PT Time Calculation (min) 30 min    Activity Tolerance Patient tolerated treatment well    Behavior During Therapy [facility] for tasks assessed/performed             Past Medical History:  Diagnosis Date   Anxiety    Arthritis    Atypical nevus 09/14/1998   right mid back - mild, Left side-slight   Atypical nevus 06/06/2002   left flank-slight/moderate RE CK   Atypical nevus 12/04/2002   right abdomen-moderate TX W/S   Atypical nevus 01/12/2011   left breast moderate/severe TX W/S   Atypical nevus 07/03/2014   rt side-mild, left thigh-mild   Atypical nevus 09/11/2017   Left shin-atypial junction MOHS EXC   Atypical nevus 01/12/2011   lower right side (moderate)   Atypical nevus 01/12/2011   rigth lower back (mod/severe) tx w/s   Carpal tunnel syndrome    Chest congestion    CHRONIC   Chronic kidney disease    STAGE III KIDNEY DISEASE   [place] level II melanoma (HCC) 06/22/2010   mid upper back REF. DR Lenis Noon   Contact lens/glasses fitting    contacts or glasses   Depression    Difficulty sleeping    GERD (gastroesophageal reflux disease)    Headache(784.0)    MIGRAINES   Hypertension    TAKES BP MED "FOR KIDNEYS" HAS HAD ELEVATED BP IN GTHE PAST   Hypothyroidism 1977   pt states she had to have radioactive iodine   Melanoma (HCC) 2 YRS AGO   Past Surgical History:  Procedure Laterality Date   CARPAL TUNNEL RELEASE Right 12/05/2012   Procedure: CARPAL TUNNEL RELEASE;  Surgeon: Wyn Forster., MD;  Location: [facility];  Service: Orthopedics;   Laterality: Right;   CARPAL TUNNEL RELEASE Left 10/21/2013   Procedure: CARPAL TUNNEL RELEASE;  Surgeon: Wyn Forster., MD;  Location: [facility];  Service: Orthopedics;  Laterality: Left;   CARPOMETACARPEL SUSPENSION PLASTY Left 10/21/2013   Procedure: TRAPEZIECTOMY FLEXOR CARPI RADIALIS KNOT PLASTY;  Surgeon: Wyn Forster., MD;  Location: [facility];  Service: Orthopedics;  Laterality: Left;   COLONOSCOPY N/A 10/15/2018   Procedure: COLONOSCOPY;  Surgeon: Franky Macho, MD;  [facility];  Service: Gastroenterology;  Laterality: N/A;   EYE SURGERY Bilateral 2018   pt states [place] had [place] done in [place]   KNEE ARTHROSCOPY     L KNEE   MELANOMA EXCISION  07/31/2009   back   PARTIAL KNEE ARTHROPLASTY  08/05/2012   Procedure: UNICOMPARTMENTAL KNEE;  Surgeon: Shelda Pal, MD;  Location: [facility];  Service: Orthopedics;  Laterality: Left;   TONSILLECTOMY     TRANSFORAMINAL LUMBAR INTERBODY FUSION (TLIF) WITH PEDICLE SCREW FIXATION 1 LEVEL N/A 04/06/2021   Procedure: TRANSFORAMINAL LUMBAR INTERBODY FUSION (TLIF) WITH PEDICLE SCREW FIXATION 1 LEVEL L4-5;  Surgeon: Venita Lick, MD;  Location: [facility];  Service: Orthopedics;  Laterality: N/A;  5 hrs 3C-Bed  TRIGGER FINGER RELEASE     L HAND   TRIGGER FINGER RELEASE Right 12/05/2012   Procedure: RELEASE TRIGGER FINGER/A-1 PULLEY RIGHT RING FINGER;  Surgeon: Wyn Forster., MD;  Location: [facility];  Service: Orthopedics;  Laterality: Right;   TRIGGER FINGER RELEASE Left 10/21/2013   Procedure: LEFT RING  STENOSING TENOSYNOVITIS RELEASE AND  LEFT THUMB;  Surgeon: Wyn Forster., MD;  Location: [facility];  Service: Orthopedics;  Laterality: Left;   Patient Active Problem List   Diagnosis Date Noted   S/P lumbar fusion 04/06/2021   Positive colorectal cancer screening using Cologuard test    Diverticulosis of large intestine without diverticulitis     Hemorrhoids without complication    Expected blood loss anemia 08/06/2012   Obese 08/06/2012   S/P left UKA 08/05/2012    PCP: Roxine Caddy REFERRING PROVIDER: Karle Barr, MD  REFERRING DIAG:  Free Text Diagnosis  dizziness    THERAPY DIAG:  Dizziness and giddiness  Repeated falls  ONSET DATE: over six months   Rationale for Evaluation and Treatment: Rehabilitation    SUBJECTIVE STATEMENT: No room spinning overall since evaluation; sometimes is still getting lightheaded when she stands up.    Eval:PT states that she has had bouts of dizziness for over six months now.  She went to the eye doctor who cleared her eyes.  She then went to the ENT who tested her inner ear and stated that it was not her inner ear and wanted her to go for vestibular therapy.  PT states  that she fell once due to her dizziness in her yard and once due to her RT knee giving out.  PERTINENT HISTORY: Rt OA, prior back surgery, see above   PAIN: no PRECAUTIONS: Fall     WEIGHT BEARING RESTRICTIONS: No  FALLS: Has patient fallen in last 6 months? Yes. Number of falls 2    PLOF: Independent  PATIENT GOALS: to be less dizzy  OBJECTIVE:    COGNITION: Overall cognitive status: Within functional limits for tasks assessed POSTURE:  No Significant postural limitations  Cervical ROM:    Active A/PROM (deg) eval  Flexion   Extension   Right lateral flexion 80 with pain  Left lateral flexion 75  Right rotation   Left rotation   (Blank rows = not tested)    FUNCTIONAL TESTS:  Single leg stance  Rt 4", LT 4"  PATIENT SURVEYS:  FOTO 50  VESTIBULAR ASSESSMENT:    SYMPTOM BEHAVIOR:  Subjective history: Pt feels that if she is moving to the left she has more dizziness.  Pt states that she started drinking more water.   Non-Vestibular symptoms: neck pain, headaches, tinnitus, nausea/vomiting, and migraine symptoms  Type of dizziness: Spinning/Vertigo and heavy headedness  Frequency:  was more frequent now it is occurring about once a week now.   Duration: 30 seconds   Aggravating factors: No known aggravating factors  Relieving factors: rest  Progression of symptoms: better  OCULOMOTOR EXAM:  Ocular Alignment: normal  Ocular ROM: No Limitations  Spontaneous Nystagmus: absent  Gaze-Induced Nystagmus: left beating with right gaze  Smooth Pursuits: saccades; Lt mild;  RT   Saccades: intact      POSITIONAL TESTING: Right Dix-Hallpike: no nystagmus Left Dix-Hallpike: sx of dizziness   MOTION SENSITIVITY:  Motion Sensitivity Quotient Intensity: 0 = none, 1 = Lightheaded, 2 = Mild, 3 = Moderate, 4 = Severe, 5 = Vomiting  Intensity 03/14/23  1.  Sitting to supine    2. Supine to L side    3. Supine to R side    4. Supine to sitting    5. L Hallpike-Dix 2 2  6. Up from L  0 0  7. R Hallpike-Dix 0   8. Up from R  2     9. Sitting, head tipped to L knee    10. Head up from L knee    11. Sitting, head tipped to R knee    12. Head up from R knee    13. Sitting head turns x5    14.Sitting head nods x5  0  15. In stance, 180 turn to L     16. In stance, 180 turn to R       VESTIBULAR TREATMENT:                                                                                                   DATE: 03/14/23 Review of HEP and goals Seated gaze stabilization with head turns x 10 each Scapular retractions in sitting 5" hold x 10  Left dix hallpike neg nystagmus but mild "odd sensation" fleeting so performed Epley  STM to bilateral upper traps to increase tissue extensibility x 10'; no other interventions performed during manual    02/28/23 Sitting in good posture hold 5" x 5 Scapular retraction x 10  Canalith Repositioning:  Epley Left: Number of Reps: 1 and Response to Treatment: symptoms improved Habituation:  Seated Horizontal Head Movements: number of reps: 5   PATIENT EDUCATION: Education details: HEP  Person educated: Patient Education method:  Explanation and Handouts Education comprehension: verbalized understanding and returned demonstration  HOME EXERCISE PROGRAM: Access Code: [email or web address] URL: [email or web address] Date: 02/28/2023 Prepared by: Virgina Organ  Exercises - Seated Gaze Stabilization with Head Rotation  - 4 x daily - 7 x weekly - 1 sets - 10 reps - 5-10 hold - Correct Seated Posture  - 4 x daily - 7 x weekly - 1 sets - 5 reps - 30 seconds  hold - Seated Scapular Retraction  - 3 x daily - 7 x weekly - 1 sets - 10 reps - 3-5" hold GOALS: Goals reviewed with patient? No  SHORT TERM GOALS: Target date: 03/21/23  Pt to be I in HEP in order to decrease her sx of dizziness by 50%  Baseline: Goal status: IN PROGRESS  2.  Pt to be able to single leg stance for 10 " on both LE to reduce risk of falling.  Baseline:  Goal status: IN PROGRESS   LONG TERM GOALS: Target date: 04/11/23  PT to be I in HEP in order to state that she has had no sx of dizziness in the past 4 weeks.  Baseline:  Goal status: IN PROGRESS  2.  PT to be able to single leg stance for 15 seconds B to reduce risk of falling  Baseline:  Goal status: IN PROGRESS   ASSESSMENT:  CLINICAL IMPRESSION: 15 minutes late for appointment today.  Today's session started with a review of HEP and goals.  Patient verbalizes agreement with set rehab goals. Needs cues with head turns to not turn so much that she loses sight of her target.  Mild "odd" sensation but no nystagumus with left dix hall-pike today  so repeated Epley; no dizziness reported during treatment today. Added STM to bilateral upper traps today to decrease tissue tightness.  Did not progress HEP or add balance activities today due to limited time.   Patient will benefit from continued skilled therapy services  to address deficits and promote return to optimal function.      Eval:Patient is a 77 y.o. female who was seen today for physical therapy evaluation and treatment for  dizziness. Evaluation demonstrates increased dizziness with LT head turn and pt verbally stating increased dizziness with LT dix-hallpike maneuver.  Pt has forward head, increased kyphosis and noted decreased balance with hx of falls.    OBJECTIVE IMPAIRMENTS: decreased balance and dizziness.   ACTIVITY LIMITATIONS: bending and squatting  PARTICIPATION LIMITATIONS:  pt is not allowing sx to limit her activity at this time.   REHAB POTENTIAL: Good  CLINICAL DECISION MAKING: Stable/uncomplicated  EVALUATION COMPLEXITY: Low   PLAN:  PT FREQUENCY: 1x/week  PT DURATION: 6 weeks  PLANNED INTERVENTIONS: Therapeutic exercises, Therapeutic activity, Neuromuscular re-education, Balance training, Patient/Family education, Self Care, and Manual therapy  PLAN FOR NEXT SESSION: continue positional testing for sx, manual to trap in case cervical-ocular reflex is affected.  Begin balance  9:49 AM, 03/14/23  Small  MPT [facility] [place] [phone]

## 2023-03-16 DIAGNOSIS — M722 Plantar fascial fibromatosis: Secondary | ICD-10-CM | POA: Diagnosis not present

## 2023-03-21 ENCOUNTER — Ambulatory Visit (HOSPITAL_COMMUNITY): Payer: Medicare PPO | Admitting: Physical Therapy

## 2023-03-21 DIAGNOSIS — G4700 Insomnia, unspecified: Secondary | ICD-10-CM | POA: Diagnosis not present

## 2023-03-21 DIAGNOSIS — E039 Hypothyroidism, unspecified: Secondary | ICD-10-CM | POA: Diagnosis not present

## 2023-03-21 DIAGNOSIS — M2550 Pain in unspecified joint: Secondary | ICD-10-CM | POA: Diagnosis not present

## 2023-03-21 DIAGNOSIS — E7849 Other hyperlipidemia: Secondary | ICD-10-CM | POA: Diagnosis not present

## 2023-03-21 DIAGNOSIS — N1831 Chronic kidney disease, stage 3a: Secondary | ICD-10-CM | POA: Diagnosis not present

## 2023-03-21 DIAGNOSIS — K219 Gastro-esophageal reflux disease without esophagitis: Secondary | ICD-10-CM | POA: Diagnosis not present

## 2023-03-21 DIAGNOSIS — I10 Essential (primary) hypertension: Secondary | ICD-10-CM | POA: Diagnosis not present

## 2023-03-21 DIAGNOSIS — R42 Dizziness and giddiness: Secondary | ICD-10-CM | POA: Diagnosis not present

## 2023-03-21 DIAGNOSIS — R296 Repeated falls: Secondary | ICD-10-CM | POA: Diagnosis not present

## 2023-03-21 DIAGNOSIS — R739 Hyperglycemia, unspecified: Secondary | ICD-10-CM | POA: Diagnosis not present

## 2023-03-21 DIAGNOSIS — F419 Anxiety disorder, unspecified: Secondary | ICD-10-CM | POA: Diagnosis not present

## 2023-03-21 NOTE — Therapy (Signed)
OUTPATIENT PHYSICAL THERAPY VESTIBULAR EVALUATION     Patient Name: Penny Morgan MRN: [id number] DOB:1946-02-16, 77 y.o., female Today's Date: 03/21/2023  END OF SESSION:  PT End of Session - 03/21/23 1340     Visit Number 3    Number of Visits 6    Date for PT Re-Evaluation 04/18/23    Authorization Type [employer]- auth put in    PT Start Time 1300    PT Stop Time 1340    PT Time Calculation (min) 40 min    Activity Tolerance Patient tolerated treatment well    Behavior During Therapy [facility] for tasks assessed/performed             Past Medical History:  Diagnosis Date   Anxiety    Arthritis    Atypical nevus 09/14/1998   right mid back - mild, Left side-slight   Atypical nevus 06/06/2002   left flank-slight/moderate RE CK   Atypical nevus 12/04/2002   right abdomen-moderate TX W/S   Atypical nevus 01/12/2011   left breast moderate/severe TX W/S   Atypical nevus 07/03/2014   rt side-mild, left thigh-mild   Atypical nevus 09/11/2017   Left shin-atypial junction MOHS EXC   Atypical nevus 01/12/2011   lower right side (moderate)   Atypical nevus 01/12/2011   rigth lower back (mod/severe) tx w/s   Carpal tunnel syndrome    Chest congestion    CHRONIC   Chronic kidney disease    STAGE III KIDNEY DISEASE   [place] level II melanoma (HCC) 06/22/2010   mid upper back REF. DR Lenis Noon   Contact lens/glasses fitting    contacts or glasses   Depression    Difficulty sleeping    GERD (gastroesophageal reflux disease)    Headache(784.0)    MIGRAINES   Hypertension    TAKES BP MED "FOR KIDNEYS" HAS HAD ELEVATED BP IN GTHE PAST   Hypothyroidism 1977   pt states she had to have radioactive iodine   Melanoma (HCC) 2 YRS AGO   Past Surgical History:  Procedure Laterality Date   CARPAL TUNNEL RELEASE Right 12/05/2012   Procedure: CARPAL TUNNEL RELEASE;  Surgeon: Wyn Forster., MD;  Location: [facility];  Service: Orthopedics;  Laterality:  Right;   CARPAL TUNNEL RELEASE Left 10/21/2013   Procedure: CARPAL TUNNEL RELEASE;  Surgeon: Wyn Forster., MD;  Location: [facility];  Service: Orthopedics;  Laterality: Left;   CARPOMETACARPEL SUSPENSION PLASTY Left 10/21/2013   Procedure: TRAPEZIECTOMY FLEXOR CARPI RADIALIS KNOT PLASTY;  Surgeon: Wyn Forster., MD;  Location: [facility];  Service: Orthopedics;  Laterality: Left;   COLONOSCOPY N/A 10/15/2018   Procedure: COLONOSCOPY;  Surgeon: Franky Macho, MD;  [facility];  Service: Gastroenterology;  Laterality: N/A;   EYE SURGERY Bilateral 2018   pt states she had this done in [place]   KNEE ARTHROSCOPY     L KNEE   MELANOMA EXCISION  07/31/2009   back   PARTIAL KNEE ARTHROPLASTY  08/05/2012   Procedure: UNICOMPARTMENTAL KNEE;  Surgeon: Shelda Pal, MD;  Location: [facility];  Service: Orthopedics;  Laterality: Left;   TONSILLECTOMY     TRANSFORAMINAL LUMBAR INTERBODY FUSION (TLIF) WITH PEDICLE SCREW FIXATION 1 LEVEL N/A 04/06/2021   Procedure: TRANSFORAMINAL LUMBAR INTERBODY FUSION (TLIF) WITH PEDICLE SCREW FIXATION 1 LEVEL L4-5;  Surgeon: Venita Lick, MD;  Location: [facility];  Service: Orthopedics;  Laterality: N/A;  5 hrs 3C-Bed   TRIGGER FINGER  RELEASE     L HAND   TRIGGER FINGER RELEASE Right 12/05/2012   Procedure: RELEASE TRIGGER FINGER/A-1 PULLEY RIGHT RING FINGER;  Surgeon: Wyn Forster., MD;  Location: [facility];  Service: Orthopedics;  Laterality: Right;   TRIGGER FINGER RELEASE Left 10/21/2013   Procedure: LEFT RING  STENOSING TENOSYNOVITIS RELEASE AND  LEFT THUMB;  Surgeon: Wyn Forster., MD;  Location: [facility];  Service: Orthopedics;  Laterality: Left;   Patient Active Problem List   Diagnosis Date Noted   S/P lumbar fusion 04/06/2021   Positive colorectal cancer screening using Cologuard test    Diverticulosis of large intestine without diverticulitis    Hemorrhoids  without complication    Expected blood loss anemia 08/06/2012   Obese 08/06/2012   S/P left UKA 08/05/2012    PCP: Roxine Caddy REFERRING PROVIDER: Karle Barr, MD  REFERRING DIAG:  Free Text Diagnosis  dizziness    THERAPY DIAG:  Dizziness and giddiness  Repeated falls  ONSET DATE: over six months   Rationale for Evaluation and Treatment: Rehabilitation    SUBJECTIVE STATEMENT: Pt states that she has only had a couple of times where she was dizzy.  Both times it was when she was bending down to pick something up.  PT is wearing a brace on her Rt knee as it feels as if it is going to go out on her.   PERTINENT HISTORY: Rt OA, prior back surgery, see above   PAIN: no PRECAUTIONS: Fall     WEIGHT BEARING RESTRICTIONS: No  FALLS: Has patient fallen in last 6 months? Yes. Number of falls 2    PLOF: Independent  PATIENT GOALS: to be less dizzy  OBJECTIVE:    COGNITION: Overall cognitive status: Within functional limits for tasks assessed POSTURE:  No Significant postural limitations  Cervical ROM:    Active A/PROM (deg) eval  Flexion   Extension   Right lateral flexion   Left lateral flexion   Right rotation 80  Left rotation 75  (Blank rows = not tested)    FUNCTIONAL TESTS:  Single leg stance  Rt 4", LT 4"  PATIENT SURVEYS:  FOTO 50  VESTIBULAR ASSESSMENT:    SYMPTOM BEHAVIOR:  Subjective history: Pt feels that if she is moving to the left she has more dizziness.  Pt states that she started drinking more water.   Non-Vestibular symptoms: neck pain, headaches, tinnitus, nausea/vomiting, and migraine symptoms  Type of dizziness: Spinning/Vertigo and heavy headedness  Frequency: was more frequent now it is occurring about once a week now.   Duration: 30 seconds   Aggravating factors: No known aggravating factors  Relieving factors: rest  Progression of symptoms: better  OCULOMOTOR EXAM:  Ocular Alignment: normal  Ocular ROM: No  Limitations  Spontaneous Nystagmus: absent  Gaze-Induced Nystagmus: left beating with right gaze  Smooth Pursuits: saccades; Lt mild;  RT   Saccades: intact      POSITIONAL TESTING: Right Dix-Hallpike: no nystagmus Left Dix-Hallpike: sx of dizziness   MOTION SENSITIVITY:  Motion Sensitivity Quotient Intensity: 0 = none, 1 = Lightheaded, 2 = Mild, 3 = Moderate, 4 = Severe, 5 = Vomiting  Intensity 03/14/23 8/21  1. Sitting to supine     2. Supine to L side     3. Supine to R side     4. Supine to sitting     5. L Hallpike-Dix 2 2   6. Up from L  0  0   7. R Hallpike-Dix      8. Up from R      9. Sitting, head tipped to L knee     10. Head up from L knee   2  11. Sitting, head tipped to R knee     12. Head up from R knee   2  13. Sitting head turns x5     14.Sitting head nods x5  0   15. In stance, 180 turn to L      16. In stance, 180 turn to R        VESTIBULAR TREATMENT:                                                                                                   DATE: 03/21/23: Single leg stance x 5 B Standing  gaze stabilization  Walking gaze stab both turns and nods down long hall x 2 Reps each Standing eyes closed head turns x 5  Standing smooth pursuits x 5  Sitting nose to knee x 10 rt and Lt  Scapular retraction x 10  Manual to cervical to improve motion.   03/14/23 Review of HEP and goals Seated gaze stabilization with head turns x 10 each Scapular retractions in sitting 5" hold x 10  Left dix hallpike neg nystagmus but mild "odd sensation" fleeting so performed Epley  STM to bilateral upper traps to increase tissue extensibility x 10'; no other interventions performed during manual    02/28/23 Sitting in good posture hold 5" x 5 Scapular retraction x 10  Canalith Repositioning:  Epley Left: Number of Reps: 1 and Response to Treatment: symptoms improved Habituation:  Seated Horizontal Head Movements: number of reps: 5   PATIENT  EDUCATION: Education details: HEP  Person educated: Patient Education method: Explanation and Handouts Education comprehension: verbalized understanding and returned demonstration  HOME EXERCISE PROGRAM: Access Code: [email or web address] URL: [email or web address] Date: 02/28/2023 Prepared by: Virgina Organ  Exercises - Seated Gaze Stabilization with Head Rotation  - 4 x daily - 7 x weekly - 1 sets - 10 reps - 5-10 hold - Correct Seated Posture  - 4 x daily - 7 x weekly - 1 sets - 5 reps - 30 seconds  hold - Seated Scapular Retraction  - 3 x daily - 7 x weekly - 1 sets - 10 reps - 3-5" hold 03/21/23 - Standing Single Leg Stance with Counter Support  - 2 x daily - 7 x weekly - 2 sets - 5 reps - Seated Horizontal Smooth Pursuit  - 2 x daily - 7 x weekly - 1 sets - 5 reps - Seated Vertical Smooth Pursuit  - 2 x daily - 7 x weekly - 1 sets - 5 reps - Seated Nose to Left Knee Vestibular Habituation  - 2 x daily - 7 x weekly - 1 sets - 10 reps - Seated Nose to Right Knee Vestibular Habituation  - 2 x daily - 7 x weekly - 1 sets - 10 reps GOALS: Goals reviewed with patient? No  SHORT TERM GOALS: Target date:  03/21/23  Pt to be I in HEP in order to decrease her sx of dizziness by 50%  Baseline: Goal status: met   2.  Pt to be able to single leg stance for 10 " on both LE to reduce risk of falling.  Baseline:  Goal status: IN PROGRESS   LONG TERM GOALS: Target date: 04/11/23  PT to be I in HEP in order to state that she has had no sx of dizziness in the past 4 weeks.  Baseline:  Goal status: IN PROGRESS  2.  PT to be able to single leg stance for 15 seconds B to reduce risk of falling  Baseline:  Goal status: IN PROGRESS   ASSESSMENT:  CLINICAL IMPRESSION:  Pt dizziness has decreased significantly therefore therapist increased to walking gaze stabilization as well as balance activity.  Pt HEP updated.     Patient will benefit from continued skilled therapy services  to  address deficits and promote return to optimal function.       OBJECTIVE IMPAIRMENTS: decreased balance and dizziness.   ACTIVITY LIMITATIONS: bending and squatting  PARTICIPATION LIMITATIONS:  pt is not allowing sx to limit her activity at this time.   REHAB POTENTIAL: Good  CLINICAL DECISION MAKING: Stable/uncomplicated  EVALUATION COMPLEXITY: Low   PLAN:  PT FREQUENCY: 1x/week  PT DURATION: 6 weeks  PLANNED INTERVENTIONS: Therapeutic exercises, Therapeutic activity, Neuromuscular re-education, Balance training, Patient/Family education, Self Care, and Manual therapy  PLAN FOR NEXT SESSION: continue positional testing for sx, manual to trap in case cervical-ocular reflex is affected.  Begin balance  1:42 PM, 03/21/23 Virgina Organ, PT CLT [phone]

## 2023-03-23 DIAGNOSIS — G4733 Obstructive sleep apnea (adult) (pediatric): Secondary | ICD-10-CM | POA: Diagnosis not present

## 2023-03-26 ENCOUNTER — Encounter (HOSPITAL_COMMUNITY): Payer: Medicare PPO

## 2023-03-29 DIAGNOSIS — M25561 Pain in right knee: Secondary | ICD-10-CM | POA: Diagnosis not present

## 2023-04-04 ENCOUNTER — Ambulatory Visit (HOSPITAL_COMMUNITY): Payer: Medicare PPO | Admitting: Physical Therapy

## 2023-04-04 DIAGNOSIS — R296 Repeated falls: Secondary | ICD-10-CM | POA: Insufficient documentation

## 2023-04-04 DIAGNOSIS — R42 Dizziness and giddiness: Secondary | ICD-10-CM | POA: Insufficient documentation

## 2023-04-04 NOTE — Therapy (Signed)
OUTPATIENT PHYSICAL THERAPY VESTIBULAR EVALUATION     Patient Name: Penny Morgan MRN: [id number] DOB:09/06/1945, 77 y.o., female Today's Date: 04/04/2023  END OF SESSION:  PT End of Session - 04/04/23 1115    Visit Number 4    Number of Visits 6    Date for PT Re-Evaluation 04/18/23    Authorization Type [employer]- 6 visits approved    Authorization Time Period 03/05/23-04/13/23    PT Start Time 1037   pt late   PT Stop Time 1115    PT Time Calculation (min) 38 min    Activity Tolerance Patient tolerated treatment well    Behavior During Therapy [facility] for tasks assessed/performed         Past Medical History:  Diagnosis Date   Anxiety    Arthritis    Atypical nevus 09/14/1998   right mid back - mild, Left side-slight   Atypical nevus 06/06/2002   left flank-slight/moderate RE CK   Atypical nevus 12/04/2002   right abdomen-moderate TX W/S   Atypical nevus 01/12/2011   left breast moderate/severe TX W/S   Atypical nevus 07/03/2014   rt side-mild, left thigh-mild   Atypical nevus 09/11/2017   Left shin-atypial junction MOHS EXC   Atypical nevus 01/12/2011   lower right side (moderate)   Atypical nevus 01/12/2011   rigth lower back (mod/severe) tx w/s   Carpal tunnel syndrome    Chest congestion    CHRONIC   Chronic kidney disease    STAGE III KIDNEY DISEASE   [place]ark level II melanoma (HCC) 06/22/2010   mid upper back REF. DR Lenis Noon   Contact lens/glasses fitting    contacts or glasses   Depression    Difficulty sleeping    GERD (gastroesophageal reflux disease)    Headache(784.0)    MIGRAINES   Hypertension    TAKES BP MED "FOR KIDNEYS" HAS HAD ELEVATED BP IN GTHE PAST   Hypothyroidism 1977   pt states she had to have radioactive iodine   Melanoma (HCC) 2 YRS AGO   Past Surgical History:  Procedure Laterality Date   CARPAL TUNNEL RELEASE Right 12/05/2012   Procedure: CARPAL TUNNEL RELEASE;  Surgeon: Wyn Forster., MD;  Location: [facility];  Service: Orthopedics;  Laterality: Right;   CARPAL TUNNEL RELEASE Left 10/21/2013   Procedure: CARPAL TUNNEL RELEASE;  Surgeon: Wyn Forster., MD;  Location: [facility];  Service: Orthopedics;  Laterality: Left;   CARPOMETACARPEL SUSPENSION PLASTY Left 10/21/2013   Procedure: TRAPEZIECTOMY FLEXOR CARPI RADIALIS KNOT PLASTY;  Surgeon: Wyn Forster., MD;  Location: [facility];  Service: Orthopedics;  Laterality: Left;   COLONOSCOPY N/A 10/15/2018   Procedure: COLONOSCOPY;  Surgeon: Franky Macho, MD;  [facility];  Service: Gastroenterology;  Laterality: N/A;   EYE SURGERY Bilateral 2018   pt states she had this done in [place]   KNEE ARTHROSCOPY     L KNEE   MELANOMA EXCISION  07/31/2009   back   PARTIAL KNEE ARTHROPLASTY  08/05/2012   Procedure: UNICOMPARTMENTAL KNEE;  Surgeon: Shelda Pal, MD;  Location: [facility];  Service: Orthopedics;  Laterality: Left;   TONSILLECTOMY     TRANSFORAMINAL LUMBAR INTERBODY FUSION (TLIF) WITH PEDICLE SCREW FIXATION 1 LEVEL N/A 04/06/2021   Procedure: TRANSFORAMINAL LUMBAR INTERBODY FUSION (TLIF) WITH PEDICLE SCREW FIXATION 1 LEVEL L4-5;  Surgeon: Venita Lick, MD;  Location: [facility];  Service: Orthopedics;  Laterality: N/A;  5 hrs  3C-Bed   TRIGGER FINGER RELEASE     L HAND   TRIGGER FINGER RELEASE Right 12/05/2012   Procedure: RELEASE TRIGGER FINGER/A-1 PULLEY RIGHT RING FINGER;  Surgeon: Wyn Forster., MD;  Location: [facility];  Service: Orthopedics;  Laterality: Right;   TRIGGER FINGER RELEASE Left 10/21/2013   Procedure: LEFT RING  STENOSING TENOSYNOVITIS RELEASE AND  LEFT THUMB;  Surgeon: Wyn Forster., MD;  Location: [facility];  Service: Orthopedics;  Laterality: Left;   Patient Active Problem List   Diagnosis Date Noted   S/P lumbar fusion 04/06/2021   Positive colorectal cancer screening using Cologuard test    Diverticulosis of  large intestine without diverticulitis    Hemorrhoids without complication    Expected blood loss anemia 08/06/2012   Obese 08/06/2012   S/P left UKA 08/05/2012    PCP: Roxine Caddy REFERRING PROVIDER: Karle Barr, MD  REFERRING DIAG:  Free Text Diagnosis  dizziness    THERAPY DIAG:  Dizziness and giddiness  Repeated falls  ONSET DATE: over six months   Rationale for Evaluation and Treatment: Rehabilitation    SUBJECTIVE STATEMENT: Pt states that she feels everything is helping she only has had on episode of dizziness since last treatment.  She got a cortisone shot in her knee so she has no pain.   PERTINENT HISTORY: Rt OA, prior back surgery, see above   PAIN: no PRECAUTIONS: Fall     WEIGHT BEARING RESTRICTIONS: No  FALLS: Has patient fallen in last 6 months? Yes. Number of falls 2    PLOF: Independent  PATIENT GOALS: to be less dizzy  OBJECTIVE:    COGNITION: Overall cognitive status: Within functional limits for tasks assessed POSTURE:  No Significant postural limitations  Cervical ROM:    Active A/PROM (deg) eval  Flexion   Extension   Right lateral flexion   Left lateral flexion   Right rotation 80  Left rotation 75  (Blank rows = not tested)    FUNCTIONAL TESTS:  Single leg stance  Rt 4", LT 4"  PATIENT SURVEYS:  FOTO 50  VESTIBULAR ASSESSMENT:    SYMPTOM BEHAVIOR:  Subjective history: Pt feels that if she is moving to the left she has more dizziness.  Pt states that she started drinking more water.   Non-Vestibular symptoms: neck pain, headaches, tinnitus, nausea/vomiting, and migraine symptoms  Type of dizziness: Spinning/Vertigo and heavy headedness  Frequency: was more frequent now it is occurring about once a week now.   Duration: 30 seconds   Aggravating factors: No known aggravating factors  Relieving factors: rest  Progression of symptoms: better  OCULOMOTOR EXAM:  Ocular Alignment: normal  Ocular ROM: No  Limitations  Spontaneous Nystagmus: absent  Gaze-Induced Nystagmus: left beating with right gaze  Smooth Pursuits: saccades; Lt mild;  RT   Saccades: intact      POSITIONAL TESTING: Right Dix-Hallpike: no nystagmus Left Dix-Hallpike: sx of dizziness   MOTION SENSITIVITY:  Motion Sensitivity Quotient Intensity: 0 = none, 1 = Lightheaded, 2 = Mild, 3 = Moderate, 4 = Severe, 5 = Vomiting  Intensity 03/14/23 8/21 9/3  1. Sitting to supine      2. Supine to L side      3. Supine to R side      4. Supine to sitting      5. L Hallpike-Dix 2 2    6. Up from L  0 0    7. R Hallpike-Dix  8. Up from R       9. Sitting, head tipped to L knee      10. Head up from L knee   2   11. Sitting, head tipped to R knee      12. Head up from R knee   2   13. Sitting head turns x5      14.Sitting head nods x5  0    15. In stance, 180 turn to L     3  16. In stance, 180 turn to R    3     VESTIBULAR TREATMENT:                                                                                                   DATE: 04/04/23 Tandem stance with head turns x 5 with each foot in front. Marching x 10  Single leg stance x 5  Walking down hall with head turns x 4 Walking down hall with head nods x 2 Sit to stand x 5 no dizziness  180 turns x 3 to RT and LT 3 sets Manual to cervical to decrease spasm/improve ROM  03/21/23: Single leg stance x 5 B Standing  gaze stabilization  Walking gaze stab both turns and nods down long hall x 2 Reps each Standing eyes closed head turns x 5  Standing smooth pursuits x 5  Sitting nose to knee x 10 rt and Lt  Scapular retraction x 10  Manual to cervical to improve motion.   03/14/23 Review of HEP and goals Seated gaze stabilization with head turns x 10 each Scapular retractions in sitting 5" hold x 10  Left dix hallpike neg nystagmus but mild "odd sensation" fleeting so performed Epley  STM to bilateral upper traps to increase tissue extensibility x  10'; no other interventions performed during manual    02/28/23 Sitting in good posture hold 5" x 5 Scapular retraction x 10  Canalith Repositioning:  Epley Left: Number of Reps: 1 and Response to Treatment: symptoms improved Habituation:  Seated Horizontal Head Movements: number of reps: 5   PATIENT EDUCATION: Education details: HEP  Person educated: Patient Education method: Explanation and Handouts Education comprehension: verbalized understanding and returned demonstration  HOME EXERCISE PROGRAM: Access Code: [email or web address] URL: [email or web address] Date: 02/28/2023 Prepared by: Virgina Organ  Exercises - Seated Gaze Stabilization with Head Rotation  - 4 x daily - 7 x weekly - 1 sets - 10 reps - 5-10 hold - Correct Seated Posture  - 4 x daily - 7 x weekly - 1 sets - 5 reps - 30 seconds  hold - Seated Scapular Retraction  - 3 x daily - 7 x weekly - 1 sets - 10 reps - 3-5" hold 03/21/23 - Standing Single Leg Stance with Counter Support  - 2 x daily - 7 x weekly - 2 sets - 5 reps - Seated Horizontal Smooth Pursuit  - 2 x daily - 7 x weekly - 1 sets - 5 reps - Seated Vertical Smooth Pursuit  - 2 x daily - 7 x weekly -  1 sets - 5 reps - Seated Nose to Left Knee Vestibular Habituation  - 2 x daily - 7 x weekly - 1 sets - 10 reps - Seated Nose to Right Knee Vestibular Habituation  - 2 x daily - 7 x weekly - 1 sets - 10 reps 04/03/23 - Standing March  - 2 x daily - 7 x weekly - 1 sets - 10 reps - 3" hold - Tandem Stance with Head Rotation  - 2 x daily - 7 x weekly - 1 sets - 10 reps - 3" hold - Single Leg Stance  - 2 x daily - 7 x weekly - 1 sets - 10 reps - 10-20" hold GOALS: Goals reviewed with patient? No  SHORT TERM GOALS: Target date: 03/21/23  Pt to be I in HEP in order to decrease her sx of dizziness by 50%  Baseline: Goal status: met   2.  Pt to be able to single leg stance for 10 " on both LE to reduce risk of falling.  Baseline:  Goal status: IN  PROGRESS   LONG TERM GOALS: Target date: 04/11/23  PT to be I in HEP in order to state that she has had no sx of dizziness in the past 4 weeks.  Baseline:  Goal status: IN PROGRESS  2.  PT to be able to single leg stance for 15 seconds B to reduce risk of falling  Baseline:  Goal status: IN PROGRESS   ASSESSMENT:  CLINICAL IMPRESSION:  Pt dizziness has decreased significantly therefore therapist progressed HEP to include balance activity.     Patient will benefit from continued skilled therapy services  to address deficits and promote return to optimal function.       OBJECTIVE IMPAIRMENTS: decreased balance and dizziness.   ACTIVITY LIMITATIONS: bending and squatting  PARTICIPATION LIMITATIONS:  pt is not allowing sx to limit her activity at this time.   REHAB POTENTIAL: Good  CLINICAL DECISION MAKING: Stable/uncomplicated  EVALUATION COMPLEXITY: Low   PLAN:  PT FREQUENCY: 1x/week  PT DURATION: 6 weeks  PLANNED INTERVENTIONS: Therapeutic exercises, Therapeutic activity, Neuromuscular re-education, Balance training, Patient/Family education, Self Care, and Manual therapy  PLAN FOR NEXT SESSION: continue  balance ; add 180 turns to HEP  11:18 AM, 04/04/23 Virgina Organ, PT CLT [phone]

## 2023-04-10 ENCOUNTER — Ambulatory Visit (HOSPITAL_COMMUNITY): Payer: Medicare PPO

## 2023-04-10 DIAGNOSIS — R296 Repeated falls: Secondary | ICD-10-CM

## 2023-04-10 DIAGNOSIS — R42 Dizziness and giddiness: Secondary | ICD-10-CM

## 2023-04-10 NOTE — Therapy (Signed)
OUTPATIENT PHYSICAL THERAPY VESTIBULAR TREATMENT/DISCHARGE PHYSICAL THERAPY DISCHARGE SUMMARY  Visits from Start of Care: 5  Current functional level related to goals / functional outcomes: See below   Remaining deficits: See below   Education / Equipment: HEP   Patient agrees to discharge. Patient goals were met. Patient is being discharged due to meeting the stated rehab goals.      Patient Name: Penny Morgan MRN: [id number] DOB:Jun 14, 1946, 77 y.o., female Today's Date: 04/10/2023  END OF SESSION:   PT End of Session - 04/10/23 1041     Visit Number 5    Number of Visits 6    Date for PT Re-Evaluation 04/18/23    Authorization Type [employer]- 6 visits approved    Authorization Time Period 03/05/23-04/13/23    PT Start Time 1037    PT Stop Time 1115    PT Time Calculation (min) 38 min    Activity Tolerance Patient tolerated treatment well    Behavior During Therapy [facility] for tasks assessed/performed              Past Medical History:  Diagnosis Date   Anxiety    Arthritis    Atypical nevus 09/14/1998   right mid back - mild, Left side-slight   Atypical nevus 06/06/2002   left flank-slight/moderate RE CK   Atypical nevus 12/04/2002   right abdomen-moderate TX W/S   Atypical nevus 01/12/2011   left breast moderate/severe TX W/S   Atypical nevus 07/03/2014   rt side-mild, left thigh-mild   Atypical nevus 09/11/2017   Left shin-atypial junction MOHS EXC   Atypical nevus 01/12/2011   lower right side (moderate)   Atypical nevus 01/12/2011   rigth lower back (mod/severe) tx w/s   Carpal tunnel syndrome    Chest congestion    CHRONIC   Chronic kidney disease    STAGE III KIDNEY DISEASE   [place] level II melanoma (HCC) 06/22/2010   mid upper back REF. DR Lenis Noon   Contact lens/glasses fitting    contacts or glasses   Depression    Difficulty sleeping    GERD (gastroesophageal reflux disease)    Headache(784.0)    MIGRAINES   Hypertension     TAKES BP MED "FOR KIDNEYS" HAS HAD ELEVATED BP IN GTHE PAST   Hypothyroidism 1977   pt states she had to have radioactive iodine   Melanoma (HCC) 2 YRS AGO   Past Surgical History:  Procedure Laterality Date   CARPAL TUNNEL RELEASE Right 12/05/2012   Procedure: CARPAL TUNNEL RELEASE;  Surgeon: Wyn Forster., MD;  Location: [facility];  Service: Orthopedics;  Laterality: Right;   CARPAL TUNNEL RELEASE Left 10/21/2013   Procedure: CARPAL TUNNEL RELEASE;  Surgeon: Wyn Forster., MD;  Location: [facility];  Service: Orthopedics;  Laterality: Left;   CARPOMETACARPEL SUSPENSION PLASTY Left 10/21/2013   Procedure: TRAPEZIECTOMY FLEXOR CARPI RADIALIS KNOT PLASTY;  Surgeon: Wyn Forster., MD;  Location: [facility];  Service: Orthopedics;  Laterality: Left;   COLONOSCOPY N/A 10/15/2018   Procedure: COLONOSCOPY;  Surgeon: Franky Macho, MD;  [facility];  Service: Gastroenterology;  Laterality: N/A;   EYE SURGERY Bilateral 2018   pt states she had this done in [place]   KNEE ARTHROSCOPY     L KNEE   MELANOMA EXCISION  07/31/2009   back   PARTIAL KNEE ARTHROPLASTY  08/05/2012   Procedure: UNICOMPARTMENTAL KNEE;  Surgeon: Shelda Pal, MD;  Location:  [facility];  Service: Orthopedics;  Laterality: Left;   TONSILLECTOMY     TRANSFORAMINAL LUMBAR INTERBODY FUSION (TLIF) WITH PEDICLE SCREW FIXATION 1 LEVEL N/A 04/06/2021   Procedure: TRANSFORAMINAL LUMBAR INTERBODY FUSION (TLIF) WITH PEDICLE SCREW FIXATION 1 LEVEL L4-5;  Surgeon: Venita Lick, MD;  Location: [facility];  Service: Orthopedics;  Laterality: N/A;  5 hrs 3C-Bed   TRIGGER FINGER RELEASE     L HAND   TRIGGER FINGER RELEASE Right 12/05/2012   Procedure: RELEASE TRIGGER FINGER/A-1 PULLEY RIGHT RING FINGER;  Surgeon: Wyn Forster., MD;  Location: [facility];  Service: Orthopedics;  Laterality: Right;   TRIGGER FINGER RELEASE Left 10/21/2013   Procedure:  LEFT RING  STENOSING TENOSYNOVITIS RELEASE AND  LEFT THUMB;  Surgeon: Wyn Forster., MD;  Location: [facility];  Service: Orthopedics;  Laterality: Left;   Patient Active Problem List   Diagnosis Date Noted   S/P lumbar fusion 04/06/2021   Positive colorectal cancer screening using Cologuard test    Diverticulosis of large intestine without diverticulitis    Hemorrhoids without complication    Expected blood loss anemia 08/06/2012   Obese 08/06/2012   S/P left UKA 08/05/2012    PCP: Roxine Caddy REFERRING PROVIDER: Karle Barr, MD  REFERRING DIAG:  Free Text Diagnosis  dizziness    THERAPY DIAG:  Dizziness and giddiness  Repeated falls  ONSET DATE: over six months   Rationale for Evaluation and Treatment: Rehabilitation    SUBJECTIVE STATEMENT: "90%" better overall   PERTINENT HISTORY: Rt OA, prior back surgery, see above   PAIN: no PRECAUTIONS: Fall     WEIGHT BEARING RESTRICTIONS: No  FALLS: Has patient fallen in last 6 months? Yes. Number of falls 2    PLOF: Independent  PATIENT GOALS: to be less dizzy  OBJECTIVE:    COGNITION: Overall cognitive status: Within functional limits for tasks assessed POSTURE:  No Significant postural limitations  Cervical ROM:    Active A/PROM (deg) eval  Flexion   Extension   Right lateral flexion   Left lateral flexion   Right rotation 80  Left rotation 75  (Blank rows = not tested)    FUNCTIONAL TESTS:  Single leg stance  Rt 4", LT 4"  PATIENT SURVEYS:  FOTO 50  VESTIBULAR ASSESSMENT:    SYMPTOM BEHAVIOR:  Subjective history: Pt feels that if she is moving to the left she has more dizziness.  Pt states that she started drinking more water.   Non-Vestibular symptoms: neck pain, headaches, tinnitus, nausea/vomiting, and migraine symptoms  Type of dizziness: Spinning/Vertigo and heavy headedness  Frequency: was more frequent now it is occurring about once a week now.   Duration:  30 seconds   Aggravating factors: No known aggravating factors  Relieving factors: rest  Progression of symptoms: better  OCULOMOTOR EXAM:  Ocular Alignment: normal  Ocular ROM: No Limitations  Spontaneous Nystagmus: absent  Gaze-Induced Nystagmus: left beating with right gaze  Smooth Pursuits: saccades; Lt mild;  RT   Saccades: intact      POSITIONAL TESTING: Right Dix-Hallpike: no nystagmus Left Dix-Hallpike: sx of dizziness   MOTION SENSITIVITY:  Motion Sensitivity Quotient Intensity: 0 = none, 1 = Lightheaded, 2 = Mild, 3 = Moderate, 4 = Severe, 5 = Vomiting  Intensity 03/14/23 8/21 9/3  1. Sitting to supine      2. Supine to L side      3. Supine to R side  4. Supine to sitting      5. L Hallpike-Dix 2 2    6. Up from L  0 0    7. R Hallpike-Dix       8. Up from R       9. Sitting, head tipped to L knee      10. Head up from L knee   2   11. Sitting, head tipped to R knee      12. Head up from R knee   2   13. Sitting head turns x5      14.Sitting head nods x5  0    15. In stance, 180 turn to L     3  16. In stance, 180 turn to R    3     VESTIBULAR TREATMENT:                                                                                                   DATE: 04/10/23 Progress note SLS bilaterally 15" each FOTO 70 STM to cervical spine x 12' to improve cervical mobility; no other intervention performed during manual   04/04/23 Tandem stance with head turns x 5 with each foot in front. Marching x 10  Single leg stance x 5  Walking down hall with head turns x 4 Walking down hall with head nods x 2 Sit to stand x 5 no dizziness  180 turns x 3 to RT and LT 3 sets Manual to cervical to decrease spasm/improve ROM  03/21/23: Single leg stance x 5 B Standing  gaze stabilization  Walking gaze stab both turns and nods down long hall x 2 Reps each Standing eyes closed head turns x 5  Standing smooth pursuits x 5  Sitting nose to knee x 10 rt and Lt   Scapular retraction x 10  Manual to cervical to improve motion.   03/14/23 Review of HEP and goals Seated gaze stabilization with head turns x 10 each Scapular retractions in sitting 5" hold x 10  Left dix hallpike neg nystagmus but mild "odd sensation" fleeting so performed Epley  STM to bilateral upper traps to increase tissue extensibility x 10'; no other interventions performed during manual    02/28/23 Sitting in good posture hold 5" x 5 Scapular retraction x 10  Canalith Repositioning:  Epley Left: Number of Reps: 1 and Response to Treatment: symptoms improved Habituation:  Seated Horizontal Head Movements: number of reps: 5   PATIENT EDUCATION: Education details: HEP  Person educated: Patient Education method: Explanation and Handouts Education comprehension: verbalized understanding and returned demonstration  HOME EXERCISE PROGRAM: Access Code: [email or web address] URL: [email or web address] Date: 02/28/2023 Prepared by: Virgina Organ  Exercises - Seated Gaze Stabilization with Head Rotation  - 4 x daily - 7 x weekly - 1 sets - 10 reps - 5-10 hold - Correct Seated Posture  - 4 x daily - 7 x weekly - 1 sets - 5 reps - 30 seconds  hold - Seated Scapular Retraction  - 3 x daily - 7 x weekly - 1 sets - 10  reps - 3-5" hold 03/21/23 - Standing Single Leg Stance with Counter Support  - 2 x daily - 7 x weekly - 2 sets - 5 reps - Seated Horizontal Smooth Pursuit  - 2 x daily - 7 x weekly - 1 sets - 5 reps - Seated Vertical Smooth Pursuit  - 2 x daily - 7 x weekly - 1 sets - 5 reps - Seated Nose to Left Knee Vestibular Habituation  - 2 x daily - 7 x weekly - 1 sets - 10 reps - Seated Nose to Right Knee Vestibular Habituation  - 2 x daily - 7 x weekly - 1 sets - 10 reps 04/03/23 - Standing March  - 2 x daily - 7 x weekly - 1 sets - 10 reps - 3" hold - Tandem Stance with Head Rotation  - 2 x daily - 7 x weekly - 1 sets - 10 reps - 3" hold - Single Leg Stance  - 2 x daily  - 7 x weekly - 1 sets - 10 reps - 10-20" hold GOALS: Goals reviewed with patient? No  SHORT TERM GOALS: Target date: 03/21/23  Pt to be I in HEP in order to decrease her sx of dizziness by 50%  Baseline: Goal status: met   2.  Pt to be able to single leg stance for 10 " on both LE to reduce risk of falling.  Baseline:  Goal status: met  LONG TERM GOALS: Target date: 04/11/23  PT to be I in HEP in order to state that she has had no sx of dizziness in the past 4 weeks.  Baseline:  Goal status: met (only one time of lightheadness)  2.  PT to be able to single leg stance for 15 seconds B to reduce risk of falling  Baseline:  Goal status: met   ASSESSMENT:  CLINICAL IMPRESSION: Progress note; has met all set rehab goals and is agreeable to discharge at this time.    OBJECTIVE IMPAIRMENTS: decreased balance and dizziness.   ACTIVITY LIMITATIONS: bending and squatting  PARTICIPATION LIMITATIONS:  pt is not allowing sx to limit her activity at this time.   REHAB POTENTIAL: Good  CLINICAL DECISION MAKING: Stable/uncomplicated  EVALUATION COMPLEXITY: Low   PLAN:  PT FREQUENCY: 1x/week  PT DURATION: 6 weeks  PLANNED INTERVENTIONS: Therapeutic exercises, Therapeutic activity, Neuromuscular re-education, Balance training, Patient/Family education, Self Care, and Manual therapy  PLAN FOR NEXT SESSION: discharge  11:15 AM, 04/10/23 Sanjeev Main Small Kimmerly Lora MPT [facility] [place] [phone] [phone]

## 2023-04-16 ENCOUNTER — Encounter (HOSPITAL_COMMUNITY): Payer: Medicare PPO

## 2023-04-23 DIAGNOSIS — G4733 Obstructive sleep apnea (adult) (pediatric): Secondary | ICD-10-CM | POA: Diagnosis not present

## 2023-05-23 DIAGNOSIS — G4733 Obstructive sleep apnea (adult) (pediatric): Secondary | ICD-10-CM | POA: Diagnosis not present

## 2023-06-16 IMAGING — RF DG LUMBAR SPINE 2-3V
1 series · 3 of 3 positions shown · non-contrast
Comparison: None.

CLINICAL DATA: TLIF

EXAM:
LUMBAR SPINE - 2-3 VIEW

[Series 1: run · 3 of 3 slices shown]
[im 1/3]
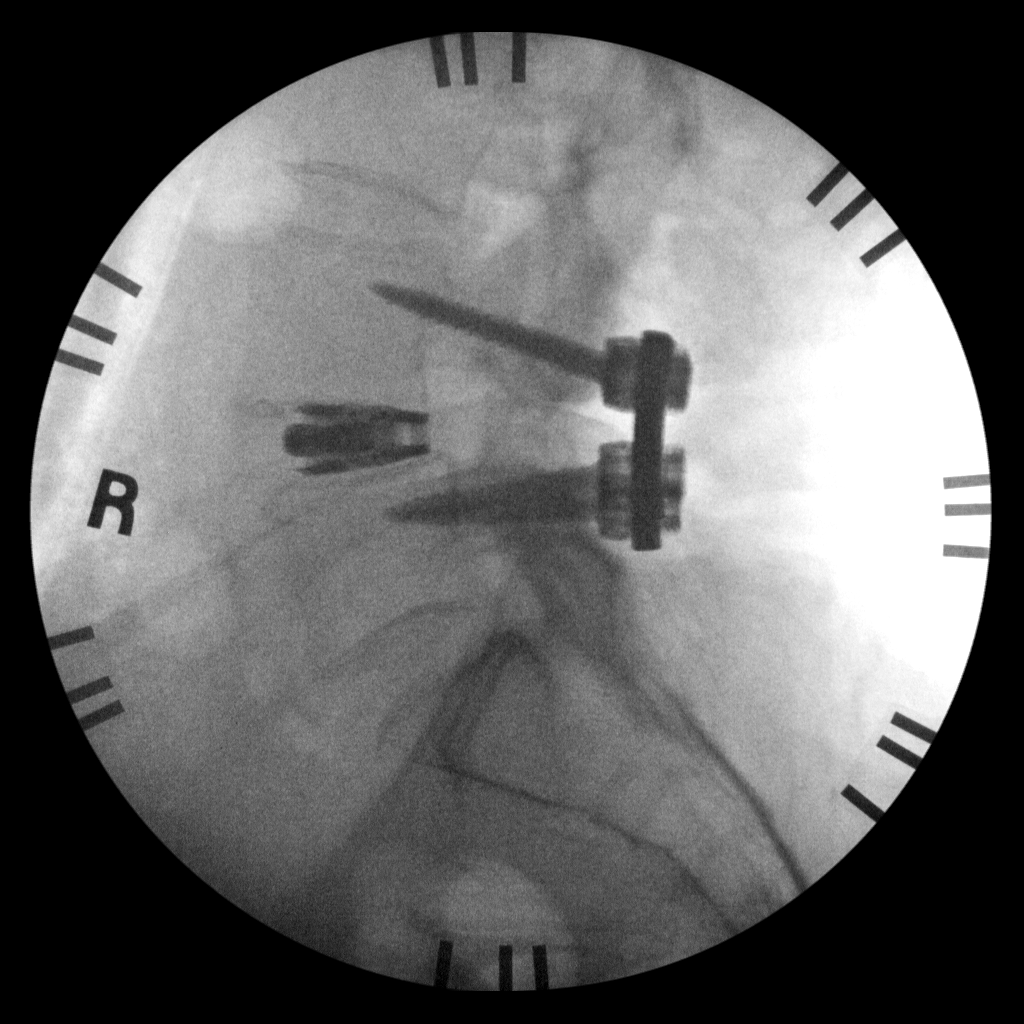
[im 2/3]
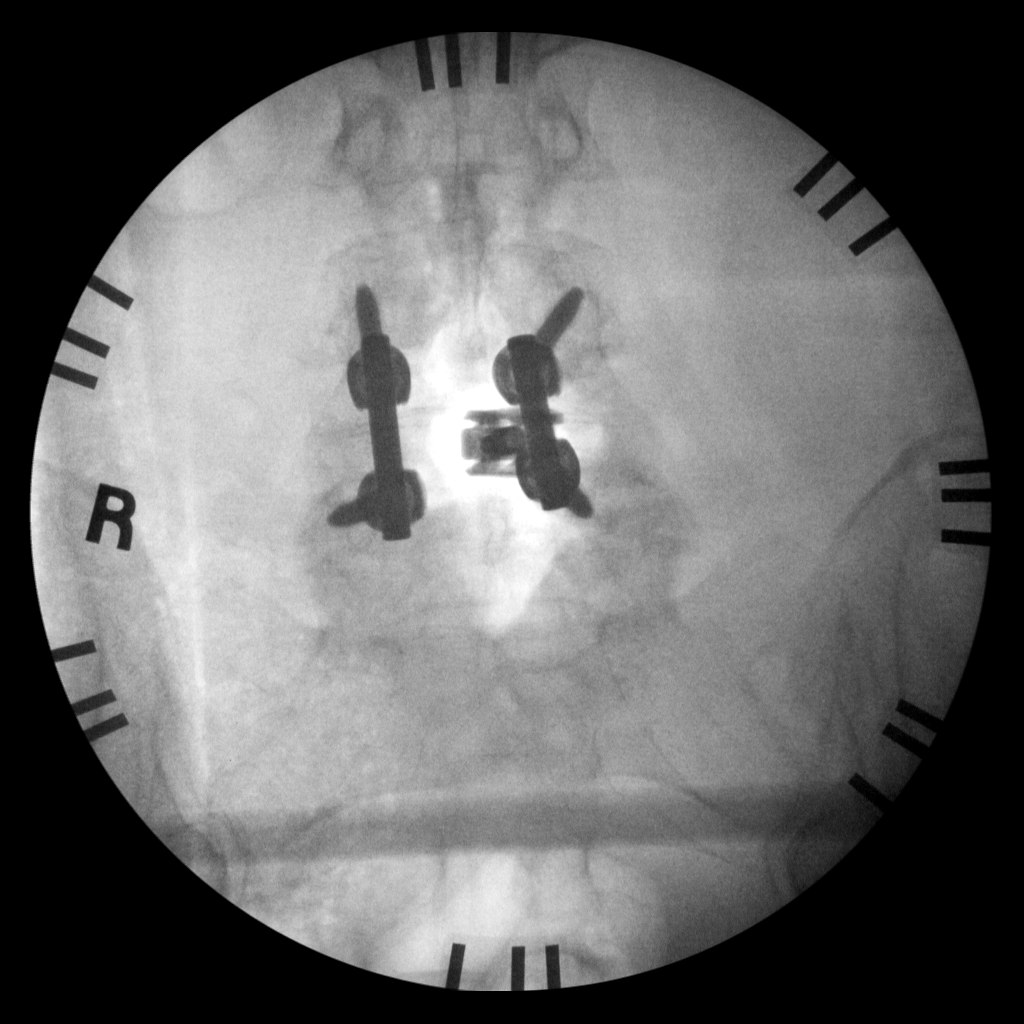
[im 3/3]
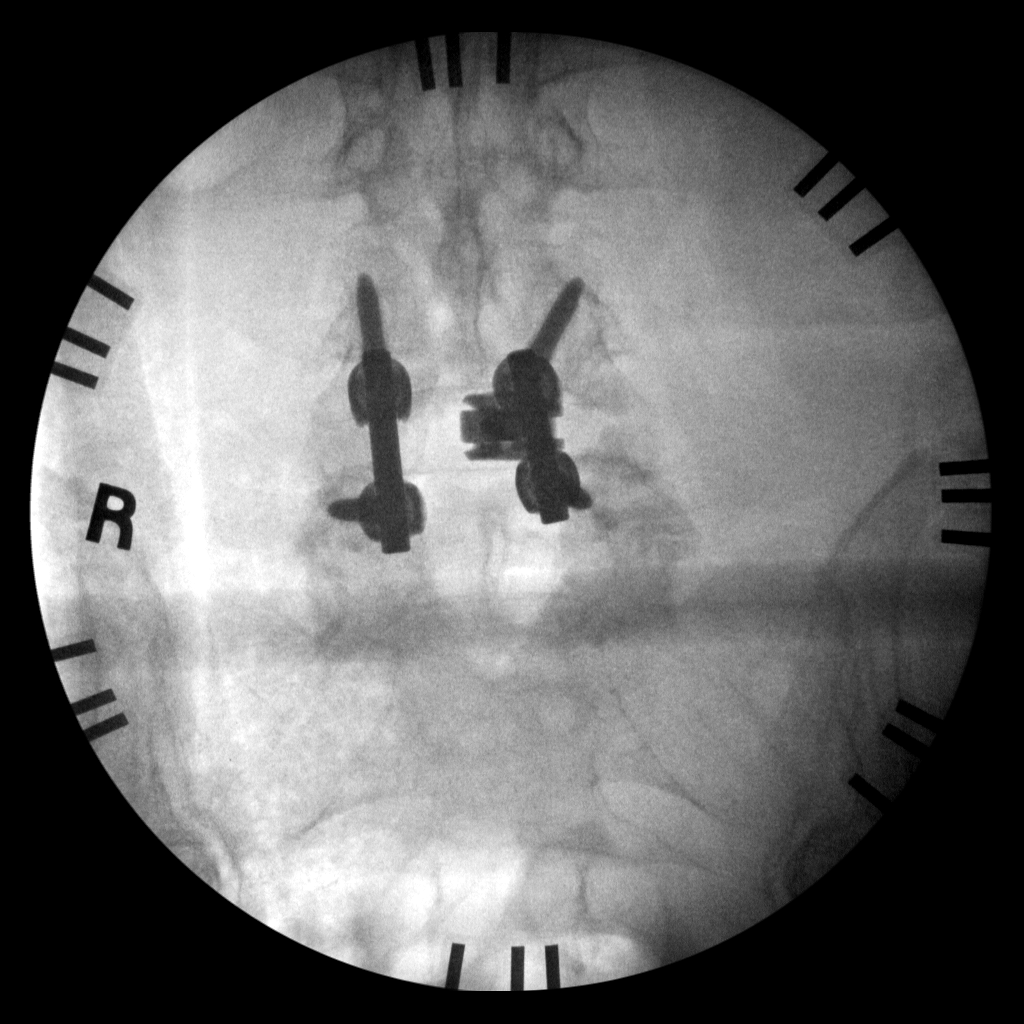

[3 of 3 positions shown; findings below may reference images not displayed]

FINDINGS: Three low resolution intraoperative spot views of the lumbar spine.
Total fluoroscopy time was 3 minutes 22 seconds. The images
demonstrate posterior rods and fixating screws at L4-L5 with
interbody device.
IMPRESSION: Intraoperative fluoroscopic assistance provided during lumbar spine
surgery

## 2023-06-23 DIAGNOSIS — G4733 Obstructive sleep apnea (adult) (pediatric): Secondary | ICD-10-CM | POA: Diagnosis not present

## 2023-07-03 DIAGNOSIS — Z1231 Encounter for screening mammogram for malignant neoplasm of breast: Secondary | ICD-10-CM | POA: Diagnosis not present

## 2023-07-10 DIAGNOSIS — Z780 Asymptomatic menopausal state: Secondary | ICD-10-CM | POA: Diagnosis not present

## 2023-07-10 DIAGNOSIS — M8589 Other specified disorders of bone density and structure, multiple sites: Secondary | ICD-10-CM | POA: Diagnosis not present

## 2023-07-10 DIAGNOSIS — M810 Age-related osteoporosis without current pathological fracture: Secondary | ICD-10-CM | POA: Diagnosis not present

## 2023-07-23 DIAGNOSIS — G4733 Obstructive sleep apnea (adult) (pediatric): Secondary | ICD-10-CM | POA: Diagnosis not present

## 2023-08-23 DIAGNOSIS — G4733 Obstructive sleep apnea (adult) (pediatric): Secondary | ICD-10-CM | POA: Diagnosis not present

## 2023-08-23 DIAGNOSIS — E119 Type 2 diabetes mellitus without complications: Secondary | ICD-10-CM | POA: Diagnosis not present

## 2023-08-23 DIAGNOSIS — E7849 Other hyperlipidemia: Secondary | ICD-10-CM | POA: Diagnosis not present

## 2023-08-28 DIAGNOSIS — Z6830 Body mass index (BMI) 30.0-30.9, adult: Secondary | ICD-10-CM | POA: Diagnosis not present

## 2023-08-28 DIAGNOSIS — Z780 Asymptomatic menopausal state: Secondary | ICD-10-CM | POA: Diagnosis not present

## 2023-08-28 DIAGNOSIS — R1319 Other dysphagia: Secondary | ICD-10-CM | POA: Diagnosis not present

## 2023-08-28 DIAGNOSIS — Z85820 Personal history of malignant melanoma of skin: Secondary | ICD-10-CM | POA: Diagnosis not present

## 2023-08-28 DIAGNOSIS — E782 Mixed hyperlipidemia: Secondary | ICD-10-CM | POA: Diagnosis not present

## 2023-08-29 ENCOUNTER — Other Ambulatory Visit (HOSPITAL_COMMUNITY): Payer: Self-pay | Admitting: Occupational Therapy

## 2023-08-29 DIAGNOSIS — M9901 Segmental and somatic dysfunction of cervical region: Secondary | ICD-10-CM | POA: Diagnosis not present

## 2023-08-29 DIAGNOSIS — M47812 Spondylosis without myelopathy or radiculopathy, cervical region: Secondary | ICD-10-CM | POA: Diagnosis not present

## 2023-08-29 DIAGNOSIS — K219 Gastro-esophageal reflux disease without esophagitis: Secondary | ICD-10-CM

## 2023-08-29 DIAGNOSIS — R1312 Dysphagia, oropharyngeal phase: Secondary | ICD-10-CM

## 2023-08-29 DIAGNOSIS — S233XXA Sprain of ligaments of thoracic spine, initial encounter: Secondary | ICD-10-CM | POA: Diagnosis not present

## 2023-08-29 DIAGNOSIS — M9902 Segmental and somatic dysfunction of thoracic region: Secondary | ICD-10-CM | POA: Diagnosis not present

## 2023-09-04 DIAGNOSIS — M47812 Spondylosis without myelopathy or radiculopathy, cervical region: Secondary | ICD-10-CM | POA: Diagnosis not present

## 2023-09-04 DIAGNOSIS — S233XXA Sprain of ligaments of thoracic spine, initial encounter: Secondary | ICD-10-CM | POA: Diagnosis not present

## 2023-09-04 DIAGNOSIS — M9902 Segmental and somatic dysfunction of thoracic region: Secondary | ICD-10-CM | POA: Diagnosis not present

## 2023-09-04 DIAGNOSIS — M9901 Segmental and somatic dysfunction of cervical region: Secondary | ICD-10-CM | POA: Diagnosis not present

## 2023-09-23 DIAGNOSIS — G4733 Obstructive sleep apnea (adult) (pediatric): Secondary | ICD-10-CM | POA: Diagnosis not present

## 2023-09-26 ENCOUNTER — Ambulatory Visit (HOSPITAL_COMMUNITY): Payer: Medicare PPO | Admitting: Speech Pathology

## 2023-09-26 ENCOUNTER — Other Ambulatory Visit (HOSPITAL_COMMUNITY): Payer: Medicare PPO

## 2023-09-27 DIAGNOSIS — I6789 Other cerebrovascular disease: Secondary | ICD-10-CM | POA: Diagnosis not present

## 2023-09-27 DIAGNOSIS — J3489 Other specified disorders of nose and nasal sinuses: Secondary | ICD-10-CM | POA: Diagnosis not present

## 2023-09-27 DIAGNOSIS — R42 Dizziness and giddiness: Secondary | ICD-10-CM | POA: Diagnosis not present

## 2023-09-27 DIAGNOSIS — I6782 Cerebral ischemia: Secondary | ICD-10-CM | POA: Diagnosis not present

## 2023-10-04 ENCOUNTER — Telehealth: Payer: Self-pay

## 2023-10-04 NOTE — Telephone Encounter (Signed)
 Called pt to schedule her appointment with out cardiologist. She has decided to go to a different office as we can not get her in as soon as she would like as well as she waited on the phone for 35 minutes with the call center.

## 2023-10-17 DIAGNOSIS — Z961 Presence of intraocular lens: Secondary | ICD-10-CM | POA: Diagnosis not present

## 2023-10-21 DIAGNOSIS — G4733 Obstructive sleep apnea (adult) (pediatric): Secondary | ICD-10-CM | POA: Diagnosis not present

## 2023-10-29 ENCOUNTER — Ambulatory Visit (HOSPITAL_COMMUNITY): Payer: Medicare PPO | Attending: General Practice | Admitting: Speech Pathology

## 2023-10-29 ENCOUNTER — Encounter (HOSPITAL_COMMUNITY): Payer: Self-pay | Admitting: Speech Pathology

## 2023-10-29 ENCOUNTER — Ambulatory Visit (HOSPITAL_COMMUNITY)
Admission: RE | Admit: 2023-10-29 | Discharge: 2023-10-29 | Disposition: A | Discharge status: Home / Self Care | Payer: Medicare PPO | Source: Ambulatory Visit | Attending: General Practice | Admitting: General Practice

## 2023-10-29 ENCOUNTER — Other Ambulatory Visit: Payer: Self-pay

## 2023-10-29 DIAGNOSIS — K219 Gastro-esophageal reflux disease without esophagitis: Secondary | ICD-10-CM | POA: Insufficient documentation

## 2023-10-29 DIAGNOSIS — R1312 Dysphagia, oropharyngeal phase: Secondary | ICD-10-CM | POA: Insufficient documentation

## 2023-10-29 DIAGNOSIS — R1310 Dysphagia, unspecified: Secondary | ICD-10-CM | POA: Diagnosis not present

## 2023-10-29 NOTE — Therapy (Signed)
 Modified Barium Swallow Study  Patient Details  Name: Penny Morgan MRN: [id number] Date of Birth: 18-Jul-1946  Today's Date: 10/29/2023  HPI/PMH: HPI: Penny Morgan is a 78 yo female who was referred for MBSS by Roxine Caddy, PA-C due to Pt reports of solid food dysphagia (hamburger and steak) x 1 year and a "lifetime" of "choking on saliva". Pt denies any URI or PNA and has no difficulty swallowing her medication. She denies odynophagia, but does endorse mild xerostomia.   Clinical Impression: Clinical Impression: Pt presents with normal oropharygeal swallow with timely swallow trigger (posterior angle of the ramus and occasionally at the valleculae), occasionally reduced timing of epiglottic deflection, adequate hyolaryngeal excursion and airway protection resulting in trace lingual residuals after the primary swallow and trace lateral channel residuals, which clear with spontaneoud repeat/dry swallow. Pt noted to have mild prominence in cricopharygeus, which was most apparent with HTL presentations and did result in trace backflow to the level of the pyriform, but was then swallowed and cleared. Radiologist PA commented on possible web, however upon SLP review of imaging, this may be air. This should be checked out by ENT to be sure and also given Pt's reports of "choking on saliva". No pain or change in vocal quality per Pt. Pt's reports of difficulty swallowing meats and globus sensation in mid chest may be more due to esophageal dysphagia and a dedication esophageal study may be indicated (barium pill esophagram or referral to GI for possible EGD), however esophageal sweep completed today showed clearance of barium in the esophagus. Recommend regular textures and cut up meats well, thin liquids, and medication whole with water. No further SLP services indicated at this time.  Factors that may increase risk of adverse event in presence of aspiration Rubye Oaks & Clearance Coots 2021): No data  recorded  Recommendations/Plan: Swallowing Evaluation Recommendations Swallowing Evaluation Recommendations Recommendations: PO diet PO Diet Recommendation: Regular; Thin liquids (Level 0) Liquid Administration via: Cup; Straw Medication Administration: Whole meds with liquid Supervision: Patient able to self-feed Swallowing strategies  : -- (cut up meats well, add moisture as needed) Postural changes: Position pt fully upright for meals; Stay upright 30-60 min after meals Oral care recommendations: Oral care BID (2x/day) Recommended consults: Consider ENT consultation; Consider GI consultation; Consider esophageal assessment    Treatment Plan Treatment Plan Treatment recommendations: No treatment recommended at this time Follow-up recommendations: No SLP follow up     Recommendations Recommendations for follow up therapy are one component of a multi-disciplinary discharge planning process, led by the attending physician.  Recommendations may be updated based on patient status, additional functional criteria and insurance authorization.  Assessment: Orofacial Exam: Orofacial Exam Oral Cavity: Oral Hygiene: Xerostomia (per Pt report, mild) Oral Cavity - Dentition: Other (Comment) (partials and implants, natural teeth, impaired bite) Orofacial Anatomy: WFL Oral Motor/Sensory Function: WFL    Anatomy:  Anatomy: WFL; Prominent cricopharyngeus; Suspected cervical osteophytes   Boluses Administered: Boluses Administered Boluses Administered: Thin liquids (Level 0); Mildly thick liquids (Level 2, nectar thick); Moderately thick liquids (Level 3, honey thick); Puree; Solid     Oral Impairment Domain: Oral Impairment Domain Lip Closure: No labial escape Tongue control during bolus hold: Cohesive bolus between tongue to palatal seal Bolus preparation/mastication: Timely and efficient chewing and mashing Bolus transport/lingual motion: Brisk tongue motion Oral residue:  Trace residue lining oral structures Location of oral residue : Tongue Initiation of pharyngeal swallow : Posterior angle of the ramus; Valleculae     Pharyngeal Impairment Domain: Pharyngeal  Impairment Domain Soft palate elevation: No bolus between soft palate (SP)/pharyngeal wall (PW) Laryngeal elevation: Complete superior movement of thyroid cartilage with complete approximation of arytenoids to epiglottic petiole Anterior hyoid excursion: Complete anterior movement Epiglottic movement: Complete inversion; Partial inversion Laryngeal vestibule closure: Complete, no air/contrast in laryngeal vestibule Pharyngeal stripping wave : Present - complete Pharyngeal contraction (A/P view only): Complete Pharyngoesophageal segment opening: Partial distention/partial duration, partial obstruction of flow (mild retention with HTL) Tongue base retraction: No contrast between tongue base and posterior pharyngeal wall (PPW) Pharyngeal residue: Trace residue within or on pharyngeal structures Location of pharyngeal residue: Pyriform sinuses     Esophageal Impairment Domain: Esophageal Impairment Domain Esophageal clearance upright position: Complete clearance, esophageal coating    Pill: Pill Consistency administered: Thin liquids (Level 0) Thin liquids (Level 0): [facility]    Penetration/Aspiration Scale Score: Penetration/Aspiration Scale Score 1.  Material does not enter airway: Thin liquids (Level 0); Mildly thick liquids (Level 2, nectar thick); Moderately thick liquids (Level 3, honey thick); Puree; Solid; Pill    Compensatory Strategies: Compensatory Strategies Compensatory strategies: No       General Information: Caregiver present: No   Diet Prior to this Study: Regular; Thin liquids (Level 0)    Temperature : Normal    Respiratory Status: WFL    Supplemental O2: None (Room air)    History of Recent Intubation: No   Behavior/Cognition: Alert; Cooperative; Pleasant  mood  Self-Feeding Abilities: Able to self-feed  Baseline vocal quality/speech: Normal  Volitional Cough: Able to elicit  Volitional Swallow: Able to elicit  Exam Limitations: No limitations  Pain: Pain Assessment Pain Assessment: No/denies pain    End of Session: Start Time:No data recorded Stop Time: No data recorded Time Calculation:No data recorded Charges: No data recorded SLP visit diagnosis: SLP Visit Diagnosis: Dysphagia, oropharyngeal phase (R13.12)    Past Medical History:  Past Medical History:  Diagnosis Date   Anxiety    Arthritis    Atypical nevus 09/14/1998   right mid back - mild, Left side-slight   Atypical nevus 06/06/2002   left flank-slight/moderate RE CK   Atypical nevus 12/04/2002   right abdomen-moderate TX W/S   Atypical nevus 01/12/2011   left breast moderate/severe TX W/S   Atypical nevus 07/03/2014   rt side-mild, left thigh-mild   Atypical nevus 09/11/2017   Left shin-atypial junction MOHS EXC   Atypical nevus 01/12/2011   lower right side (moderate)   Atypical nevus 01/12/2011   rigth lower back (mod/severe) tx w/s   Carpal tunnel syndrome    Chest congestion    CHRONIC   Chronic kidney disease    STAGE III KIDNEY DISEASE   [place] level II melanoma (HCC) 06/22/2010   mid upper back REF. DR Lenis Noon   Contact lens/glasses fitting    contacts or glasses   Depression    Difficulty sleeping    GERD (gastroesophageal reflux disease)    Headache(784.0)    MIGRAINES   Hypertension    TAKES BP MED "FOR KIDNEYS" HAS HAD ELEVATED BP IN GTHE PAST   Hypothyroidism 1977   pt states she had to have radioactive iodine   Melanoma (HCC) 2 YRS AGO   Past Surgical History:  Past Surgical History:  Procedure Laterality Date   CARPAL TUNNEL RELEASE Right 12/05/2012   Procedure: CARPAL TUNNEL RELEASE;  Surgeon: Wyn Forster., MD;  Location: [facility];  Service: Orthopedics;  Laterality: Right;   CARPAL TUNNEL  RELEASE Left 10/21/2013  Procedure: CARPAL TUNNEL RELEASE;  Surgeon: Wyn Forster., MD;  Location: [facility];  Service: Orthopedics;  Laterality: Left;   CARPOMETACARPEL SUSPENSION PLASTY Left 10/21/2013   Procedure: TRAPEZIECTOMY FLEXOR CARPI RADIALIS KNOT PLASTY;  Surgeon: Wyn Forster., MD;  Location: [facility];  Service: Orthopedics;  Laterality: Left;   COLONOSCOPY N/A 10/15/2018   Procedure: COLONOSCOPY;  Surgeon: Franky Macho, MD;  [facility];  Service: Gastroenterology;  Laterality: N/A;   EYE SURGERY Bilateral 2018   pt states she had this done in [place]   KNEE ARTHROSCOPY     L KNEE   MELANOMA EXCISION  07/31/2009   back   PARTIAL KNEE ARTHROPLASTY  08/05/2012   Procedure: UNICOMPARTMENTAL KNEE;  Surgeon: Shelda Pal, MD;  Location: [facility];  Service: Orthopedics;  Laterality: Left;   TONSILLECTOMY     TRANSFORAMINAL LUMBAR INTERBODY FUSION (TLIF) WITH PEDICLE SCREW FIXATION 1 LEVEL N/A 04/06/2021   Procedure: TRANSFORAMINAL LUMBAR INTERBODY FUSION (TLIF) WITH PEDICLE SCREW FIXATION 1 LEVEL L4-5;  Surgeon: Venita Lick, MD;  Location: [facility];  Service: Orthopedics;  Laterality: N/A;  5 hrs 3C-Bed   TRIGGER FINGER RELEASE     L HAND   TRIGGER FINGER RELEASE Right 12/05/2012   Procedure: RELEASE TRIGGER FINGER/A-1 PULLEY RIGHT RING FINGER;  Surgeon: Wyn Forster., MD;  Location: [facility];  Service: Orthopedics;  Laterality: Right;   TRIGGER FINGER RELEASE Left 10/21/2013   Procedure: LEFT RING  STENOSING TENOSYNOVITIS RELEASE AND  LEFT THUMB;  Surgeon: Wyn Forster., MD;  Location: [facility];  Service: Orthopedics;  Laterality: Left;   Thank you,  Havery Moros, CCC-SLP [phone]  Jonothan Heberle 10/29/2023, 1:42 PM

## 2023-11-21 DIAGNOSIS — G4733 Obstructive sleep apnea (adult) (pediatric): Secondary | ICD-10-CM | POA: Diagnosis not present

## 2023-12-21 DIAGNOSIS — G4733 Obstructive sleep apnea (adult) (pediatric): Secondary | ICD-10-CM | POA: Diagnosis not present

## 2024-01-21 DIAGNOSIS — G4733 Obstructive sleep apnea (adult) (pediatric): Secondary | ICD-10-CM | POA: Diagnosis not present

## 2024-02-06 DIAGNOSIS — I10 Essential (primary) hypertension: Secondary | ICD-10-CM | POA: Diagnosis not present

## 2024-02-06 DIAGNOSIS — R739 Hyperglycemia, unspecified: Secondary | ICD-10-CM | POA: Diagnosis not present

## 2024-02-06 DIAGNOSIS — E039 Hypothyroidism, unspecified: Secondary | ICD-10-CM | POA: Diagnosis not present

## 2024-02-06 DIAGNOSIS — E7849 Other hyperlipidemia: Secondary | ICD-10-CM | POA: Diagnosis not present

## 2024-02-06 DIAGNOSIS — E119 Type 2 diabetes mellitus without complications: Secondary | ICD-10-CM | POA: Diagnosis not present

## 2024-02-06 DIAGNOSIS — R5383 Other fatigue: Secondary | ICD-10-CM | POA: Diagnosis not present

## 2024-02-07 DIAGNOSIS — Z780 Asymptomatic menopausal state: Secondary | ICD-10-CM | POA: Diagnosis not present

## 2024-02-07 DIAGNOSIS — Z85820 Personal history of malignant melanoma of skin: Secondary | ICD-10-CM | POA: Diagnosis not present

## 2024-02-07 DIAGNOSIS — R1319 Other dysphagia: Secondary | ICD-10-CM | POA: Diagnosis not present

## 2024-02-07 DIAGNOSIS — E782 Mixed hyperlipidemia: Secondary | ICD-10-CM | POA: Diagnosis not present

## 2024-02-07 DIAGNOSIS — Z6829 Body mass index (BMI) 29.0-29.9, adult: Secondary | ICD-10-CM | POA: Diagnosis not present

## 2024-02-13 DIAGNOSIS — M25561 Pain in right knee: Secondary | ICD-10-CM | POA: Diagnosis not present

## 2024-02-20 DIAGNOSIS — G4733 Obstructive sleep apnea (adult) (pediatric): Secondary | ICD-10-CM | POA: Diagnosis not present

## 2024-06-06 DIAGNOSIS — E7849 Other hyperlipidemia: Secondary | ICD-10-CM | POA: Diagnosis not present

## 2024-06-06 DIAGNOSIS — R739 Hyperglycemia, unspecified: Secondary | ICD-10-CM | POA: Diagnosis not present

## 2024-06-06 DIAGNOSIS — E119 Type 2 diabetes mellitus without complications: Secondary | ICD-10-CM | POA: Diagnosis not present

## 2024-06-10 DIAGNOSIS — R739 Hyperglycemia, unspecified: Secondary | ICD-10-CM | POA: Diagnosis not present

## 2024-06-10 DIAGNOSIS — G43909 Migraine, unspecified, not intractable, without status migrainosus: Secondary | ICD-10-CM | POA: Diagnosis not present

## 2024-06-10 DIAGNOSIS — Z780 Asymptomatic menopausal state: Secondary | ICD-10-CM | POA: Diagnosis not present

## 2024-06-10 DIAGNOSIS — R1319 Other dysphagia: Secondary | ICD-10-CM | POA: Diagnosis not present

## 2024-06-10 DIAGNOSIS — I10 Essential (primary) hypertension: Secondary | ICD-10-CM | POA: Diagnosis not present

## 2024-06-10 DIAGNOSIS — K219 Gastro-esophageal reflux disease without esophagitis: Secondary | ICD-10-CM | POA: Diagnosis not present

## 2024-06-10 DIAGNOSIS — R600 Localized edema: Secondary | ICD-10-CM | POA: Diagnosis not present

## 2024-06-10 DIAGNOSIS — E039 Hypothyroidism, unspecified: Secondary | ICD-10-CM | POA: Diagnosis not present

## 2024-06-10 DIAGNOSIS — N1832 Chronic kidney disease, stage 3b: Secondary | ICD-10-CM | POA: Diagnosis not present

## 2024-06-19 DIAGNOSIS — E039 Hypothyroidism, unspecified: Secondary | ICD-10-CM | POA: Diagnosis not present

## 2024-06-19 DIAGNOSIS — G4700 Insomnia, unspecified: Secondary | ICD-10-CM | POA: Diagnosis not present

## 2024-06-19 DIAGNOSIS — I10 Essential (primary) hypertension: Secondary | ICD-10-CM | POA: Diagnosis not present

## 2024-06-19 DIAGNOSIS — G4733 Obstructive sleep apnea (adult) (pediatric): Secondary | ICD-10-CM | POA: Diagnosis not present

## 2024-08-07 ENCOUNTER — Ambulatory Visit: Admitting: Internal Medicine

## 2024-08-12 ENCOUNTER — Ambulatory Visit: Admitting: Cardiology

## 2024-08-29 ENCOUNTER — Encounter: Payer: Self-pay | Admitting: *Deleted

## 2024-09-01 ENCOUNTER — Ambulatory Visit: Admitting: Cardiology

## 2024-09-05 ENCOUNTER — Ambulatory Visit: Admitting: Cardiology

## 2024-09-05 ENCOUNTER — Encounter: Payer: Self-pay | Admitting: Cardiology

## 2024-09-05 VITALS — BP 128/85 | HR 102 | Ht 65.0 in | Wt 179.4 lb

## 2024-09-05 DIAGNOSIS — R42 Dizziness and giddiness: Secondary | ICD-10-CM

## 2024-09-05 DIAGNOSIS — Z136 Encounter for screening for cardiovascular disorders: Secondary | ICD-10-CM

## 2024-09-05 NOTE — Patient Instructions (Signed)
Medication Instructions:  Continue all current medications.  Labwork: none  Testing/Procedures: none  Follow-Up: As needed.    Any Other Special Instructions Will Be Listed Below (If Applicable).  If you need a refill on your cardiac medications before your next appointment, please call your pharmacy.  

## 2024-09-05 NOTE — Progress Notes (Signed)
 "     Clinical Summary Penny Morgan is a 79 y.o.female seen today as a new consult, referred by PA Jolee for the following medical problems.   1.Dizziness - symptoms started about a year ago - symptoms could occur in any position - lightheaded feeling. Worst with standing - no specific palpitations - no syncpe - with increasing thyroid  medication, dizziness improving.  - working increasing hydration though still somewhat limited  - Drinks 2 bottles of water  day, pepsis or coke x 1, 1 cup coffee in AM - from notes has had prior ENT evaluation. Negative MRI of the head - dizziness primarily with standing,  but can also occur with  - no symptoms over the last 2 weeks.  - EKG today shows NSR - orthostatics are negative otday.       Past Medical History:  Diagnosis Date   Anxiety    Arthritis    Atypical nevus 09/14/1998   right mid back - mild, Left side-slight   Atypical nevus 06/06/2002   left flank-slight/moderate RE CK   Atypical nevus 12/04/2002   right abdomen-moderate TX W/S   Atypical nevus 01/12/2011   left breast moderate/severe TX W/S   Atypical nevus 07/03/2014   rt side-mild, left thigh-mild   Atypical nevus 09/11/2017   Left shin-atypial junction MOHS EXC   Atypical nevus 01/12/2011   lower right side (moderate)   Atypical nevus 01/12/2011   rigth lower back (mod/severe) tx w/s   Carpal tunnel syndrome    Chest congestion    CHRONIC   Chronic kidney disease    STAGE III KIDNEY DISEASE   [place]ark level II melanoma (HCC) 06/22/2010   mid upper back REF. DR LEVINE   Contact lens/glasses fitting    contacts or glasses   Depression    Difficulty sleeping    GERD (gastroesophageal reflux disease)    Headache(784.0)    MIGRAINES   Hypertension    TAKES BP MED FOR KIDNEYS HAS HAD ELEVATED BP IN GTHE PAST   Hypothyroidism 1977   pt states she had to have radioactive iodine   Melanoma (HCC) 2 YRS AGO     Allergies[1]   Current Outpatient  Medications  Medication Sig Dispense Refill   albuterol  (VENTOLIN  HFA) 108 (90 Base) MCG/ACT inhaler Inhale 2 puffs into the lungs every 6 (six) hours as needed for wheezing or shortness of breath.     furosemide  (LASIX ) 20 MG tablet Take 20 mg by mouth daily as needed for fluid.      levothyroxine  (SYNTHROID ) 100 MCG tablet Take 100 mcg by mouth every evening.     losartan  (COZAAR ) 50 MG tablet Take 50 mg by mouth daily.     montelukast  (SINGULAIR ) 10 MG tablet Take 10 mg by mouth daily.      Multiple Vitamin (MULTIVITAMIN WITH MINERALS) TABS Take 1 tablet by mouth daily.     omeprazole  (PRILOSEC ) 20 MG capsule Take 20 mg by mouth daily.     Potassium 99 MG TABS Take 99 mg by mouth daily.     rizatriptan  (MAXALT -MLT) 10 MG disintegrating tablet Take 10 mg by mouth every 2 (two) hours as needed for migraine (2 doses/24 hrs.). May repeat in 2 hours if needed for migraine     rosuvastatin  (CRESTOR ) 5 MG tablet Take 5 mg by mouth 2 (two) times a week.     venlafaxine  XR (EFFEXOR -XR) 75 MG 24 hr capsule Take 75 mg by mouth daily with breakfast.  No current facility-administered medications for this visit.     Past Surgical History:  Procedure Laterality Date   CARPAL TUNNEL RELEASE Right 12/05/2012   Procedure: CARPAL TUNNEL RELEASE;  Surgeon: Lamar LULLA Leonor Mickey., MD;  Location: [facility];  Service: Orthopedics;  Laterality: Right;   CARPAL TUNNEL RELEASE Left 10/21/2013   Procedure: CARPAL TUNNEL RELEASE;  Surgeon: Lamar LULLA Leonor Mickey., MD;  Location: [facility];  Service: Orthopedics;  Laterality: Left;   CARPOMETACARPEL SUSPENSION PLASTY Left 10/21/2013   Procedure: TRAPEZIECTOMY FLEXOR CARPI RADIALIS KNOT PLASTY;  Surgeon: Lamar LULLA Leonor Mickey., MD;  Location: [facility];  Service: Orthopedics;  Laterality: Left;   COLONOSCOPY N/A 10/15/2018   Procedure: COLONOSCOPY;  Surgeon: Mavis Anes, MD;  [facility];  Service:  Gastroenterology;  Laterality: N/A;   EYE SURGERY Bilateral 2018   pt states she had this done in [place]    KNEE ARTHROSCOPY     L KNEE   MELANOMA EXCISION  07/31/2009   back   PARTIAL KNEE ARTHROPLASTY  08/05/2012   Procedure: UNICOMPARTMENTAL KNEE;  Surgeon: Donnice JONETTA Car, MD;  Location: [facility];  Service: Orthopedics;  Laterality: Left;   TONSILLECTOMY     TRANSFORAMINAL LUMBAR INTERBODY FUSION (TLIF) WITH PEDICLE SCREW FIXATION 1 LEVEL N/A 04/06/2021   Procedure: TRANSFORAMINAL LUMBAR INTERBODY FUSION (TLIF) WITH PEDICLE SCREW FIXATION 1 LEVEL L4-5;  Surgeon: Burnetta Aures, MD;  Location: [facility];  Service: Orthopedics;  Laterality: N/A;  5 hrs 3C-Bed   TRIGGER FINGER RELEASE     L HAND   TRIGGER FINGER RELEASE Right 12/05/2012   Procedure: RELEASE TRIGGER FINGER/A-1 PULLEY RIGHT RING FINGER;  Surgeon: Lamar LULLA Leonor Mickey., MD;  Location: [facility];  Service: Orthopedics;  Laterality: Right;   TRIGGER FINGER RELEASE Left 10/21/2013   Procedure: LEFT RING  STENOSING TENOSYNOVITIS RELEASE AND  LEFT THUMB;  Surgeon: Lamar LULLA Leonor Mickey., MD;  [facility];  Service: Orthopedics;  Laterality: Left;     Allergies[2]    Family History  Problem Relation Age of Onset   Migraines Mother    Heart attack Father    Arthritis Father    Stroke Sister    Seizures Sister    Alcoholism Brother      Social History Ms. Eddie reports that she quit smoking about 9 years ago. Her smoking use included cigarettes. She has been exposed to tobacco smoke. She has never used smokeless tobacco. Ms. Hogenson reports current alcohol  use.     Physical Examination Today's Vitals   09/05/24 1319  BP: 128/85  Pulse: (!) 102  SpO2: 95%  Weight: 179 lb 6.4 oz (81.4 kg)  Height: 5' 5 (1.651 m)   Body mass index is 29.85 kg/m.  Gen: resting comfortably, no acute distress HEENT: no scleral icterus, pupils equal round and reactive, no palptable cervical  adenopathy,  CV: RRR, no m/rg, no jvd Resp: Clear to auscultation bilaterally GI: abdomen is soft, non-tender, non-distended, normal bowel sounds, no hepatosplenomegaly MSK: extremities are warm, no edema.  Skin: warm, no rash Neuro:  no focal deficits Psych: appropriate affect    Assessment and Plan  1.Dizziness - nonspecific dizziness, not overally suggestive of cardiac etiology based on description - she reports no dizziness x 2 weeks. She associated improvement in symptoms with increase in her thyroid  medication, she has also been working to increase hydration - continue aggressive hydration. Orthostatics negative today - if recurrent symptoms could  consider cardiac monitor - EKG today shows NSR  F/u as needed      Dorn PHEBE Ross, M.D.,     [1]  Allergies Allergen Reactions   Chocolate Flavoring Agent (Non-Screening) Other (See Comments)    Migraine  [2]  Allergies Allergen Reactions   Chocolate Flavoring Agent (Non-Screening) Other (See Comments)    Migraine   "
